# Patient Record
Sex: Female | Born: 1965 | Smoking: Never smoker
Health system: Southern US, Community
[De-identification: ages and names within clinical notes are randomized; demographics above are authoritative.]

## PROBLEM LIST (undated history)

## (undated) DIAGNOSIS — G5603 Carpal tunnel syndrome, bilateral upper limbs: Secondary | ICD-10-CM

## (undated) DIAGNOSIS — M545 Low back pain, unspecified: Secondary | ICD-10-CM

## (undated) DIAGNOSIS — R519 Headache, unspecified: Secondary | ICD-10-CM

## (undated) DIAGNOSIS — K219 Gastro-esophageal reflux disease without esophagitis: Secondary | ICD-10-CM

## (undated) DIAGNOSIS — Z9889 Other specified postprocedural states: Secondary | ICD-10-CM

## (undated) DIAGNOSIS — C801 Malignant (primary) neoplasm, unspecified: Secondary | ICD-10-CM

## (undated) DIAGNOSIS — E039 Hypothyroidism, unspecified: Secondary | ICD-10-CM

## (undated) DIAGNOSIS — F329 Major depressive disorder, single episode, unspecified: Secondary | ICD-10-CM

## (undated) DIAGNOSIS — R112 Nausea with vomiting, unspecified: Secondary | ICD-10-CM

## (undated) DIAGNOSIS — I517 Cardiomegaly: Secondary | ICD-10-CM

## (undated) DIAGNOSIS — E069 Thyroiditis, unspecified: Secondary | ICD-10-CM

## (undated) DIAGNOSIS — M1990 Unspecified osteoarthritis, unspecified site: Secondary | ICD-10-CM

## (undated) DIAGNOSIS — H5010 Unspecified exotropia: Secondary | ICD-10-CM

## (undated) DIAGNOSIS — N390 Urinary tract infection, site not specified: Secondary | ICD-10-CM

## (undated) DIAGNOSIS — R51 Headache: Secondary | ICD-10-CM

## (undated) DIAGNOSIS — J189 Pneumonia, unspecified organism: Secondary | ICD-10-CM

## (undated) DIAGNOSIS — L0390 Cellulitis, unspecified: Secondary | ICD-10-CM

## (undated) DIAGNOSIS — G4730 Sleep apnea, unspecified: Secondary | ICD-10-CM

## (undated) DIAGNOSIS — E0590 Thyrotoxicosis, unspecified without thyrotoxic crisis or storm: Secondary | ICD-10-CM

## (undated) DIAGNOSIS — J329 Chronic sinusitis, unspecified: Secondary | ICD-10-CM

## (undated) DIAGNOSIS — J45909 Unspecified asthma, uncomplicated: Secondary | ICD-10-CM

## (undated) DIAGNOSIS — F32A Depression, unspecified: Secondary | ICD-10-CM

## (undated) DIAGNOSIS — N731 Chronic parametritis and pelvic cellulitis: Secondary | ICD-10-CM

## (undated) DIAGNOSIS — E669 Obesity, unspecified: Secondary | ICD-10-CM

## (undated) DIAGNOSIS — H53009 Unspecified amblyopia, unspecified eye: Secondary | ICD-10-CM

## (undated) DIAGNOSIS — B009 Herpesviral infection, unspecified: Secondary | ICD-10-CM

## (undated) HISTORY — DX: Malignant (primary) neoplasm, unspecified: C80.1

## (undated) HISTORY — PX: CYST EXCISION: SHX5701

## (undated) HISTORY — PX: ABDOMINAL HYSTERECTOMY: SHX81

## (undated) HISTORY — PX: EYE SURGERY: SHX253

## (undated) HISTORY — PX: TONSILLECTOMY AND ADENOIDECTOMY: SUR1326

## (undated) HISTORY — PX: BILATERAL CARPAL TUNNEL RELEASE: SHX6508

## (undated) HISTORY — PX: CHOLECYSTECTOMY: SHX55

---

## 2011-08-14 HISTORY — PX: PAROTIDECTOMY W/ NECK DISSECTION TOTAL: SUR1004

## 2013-10-01 HISTORY — PX: STRABISMUS SURGERY: SHX218

## 2016-08-21 ENCOUNTER — Other Ambulatory Visit (HOSPITAL_COMMUNITY): Payer: Self-pay | Admitting: Surgery

## 2016-08-27 ENCOUNTER — Encounter: Payer: BLUE CROSS/BLUE SHIELD | Attending: Surgery | Admitting: Registered"

## 2016-08-27 ENCOUNTER — Encounter: Payer: Self-pay | Admitting: Registered"

## 2016-08-27 DIAGNOSIS — E669 Obesity, unspecified: Secondary | ICD-10-CM

## 2016-08-27 DIAGNOSIS — Z713 Dietary counseling and surveillance: Secondary | ICD-10-CM | POA: Insufficient documentation

## 2016-08-27 DIAGNOSIS — Z6837 Body mass index (BMI) 37.0-37.9, adult: Secondary | ICD-10-CM | POA: Diagnosis not present

## 2016-08-27 NOTE — Progress Notes (Signed)
Pre-Op Assessment Visit:  Pre-Operative RYGB Surgery  Medical Nutrition Therapy:  Appt start time: 2:10  End time:  3:10  Patient was seen on 08/27/2016 for Pre-Operative Nutrition Assessment. Assessment and letter of approval faxed to [facility] Bariatric Surgery Program coordinator on 08/27/2016.   Pt expectation of surgery: walk up stairs without having short of breath, able to run again, relieve back pain  Pt expectation of Dietitian: "help me be able to do what needs to be done to keep weight off and help to stay on track"  Start weight at [facility]: 209.7 BMI: 38.05   Pt states she works 3rd shift 7p-7a at [employer]. Pt states she doesn't like certain vegetables, but willing to continue to try them.   Per insurance, pt needs 6 SWL visits prior to surgery.    24 hr Dietary Recall: First Meal: Subway-subs or corndogs Snack: candy bar, soda Second Meal: ham and cheese sandwich, baked cheetos Snack: peanut butter, freeze pops Third Meal: chicken pot pie Snack: none Beverages: soda, water w/ crystal light, unsweet tea w/ splenda  Encouraged to engage in 150 minutes of moderate physical activity including cardiovascular and weight baring weekly  Handouts given during visit include:  . Pre-Op Goals . Bariatric Surgery Protein Shakes . Vitamin and Mineral Recommendations  During the appointment today the following Pre-Op Goals were reviewed with the patient: . Maintain or lose weight as instructed by your surgeon . Make healthy food choices . Begin to limit portion sizes . Limited concentrated sugars and fried foods . Keep fat/sugar in the single digits per serving on          food labels . Practice CHEWING your food  (aim for 30 chews per bite or until applesauce consistency) . Practice not drinking 15 minutes before, during, and 30 minutes after each meal/snack . Avoid all carbonated beverages  . Avoid/limit caffeinated beverages  . Avoid all sugar-sweetened  beverages . Consume 3 meals per day; eat every 3-5 hours . Make a list of non-food related activities . Aim for 64-100 ounces of FLUID daily  . Aim for at least 60-80 grams of PROTEIN daily . Look for a liquid protein source that contain ?15 g protein and ?5 g carbohydrate  (ex: shakes, drinks, shots) . Physical activity is an important part of a healthy lifestyle so keep it moving!  Follow diet recommendations listed below Energy and Macronutrient Recommendations: Calories: 1600 Carbohydrate: 180 Protein: 120 Fat: 44  Demonstrated degree of understanding via:  Teach Back   Teaching Method Utilized:  Visual Auditory Hands on  Barriers to learning/adherence to lifestyle change: none  Patient to call the [facility] to enroll in Pre-Op and Post-Op Nutrition Education when surgery date is scheduled.

## 2016-09-27 ENCOUNTER — Encounter: Payer: Self-pay | Admitting: Registered"

## 2016-09-27 ENCOUNTER — Encounter: Payer: BLUE CROSS/BLUE SHIELD | Attending: Surgery | Admitting: Registered"

## 2016-09-27 DIAGNOSIS — E669 Obesity, unspecified: Secondary | ICD-10-CM

## 2016-09-27 DIAGNOSIS — Z6837 Body mass index (BMI) 37.0-37.9, adult: Secondary | ICD-10-CM | POA: Diagnosis not present

## 2016-09-27 DIAGNOSIS — Z713 Dietary counseling and surveillance: Secondary | ICD-10-CM | POA: Insufficient documentation

## 2016-09-27 NOTE — Progress Notes (Signed)
Appt start time: 4:46 end time: 5:05  Assessment: 1st SWL Appointment.   Start Wt at [facility]: 209.7 Wt: 210.9 BMI: 38.26   Pt arrives having gained 1.2 lbs from previous visit. Pt states she has decreased her soda intake and drinking more water (about 1 gallon a day). Pt states she is trying to walk more but limited to back pain. Pt reports she has decreased from 3-6 sodas/day to drinking 0-1 soda per day. Pt states she typically swims daily but pool is being cleaned due to recent rain. Pt states she feels bloated and acid reflux makes her throat feel like its burning. Pt states she has an upcoming appt related to GERD.  Pt states she hopes to have surgery done by the end of 2018.    MEDICATIONS: See list; change from prilosec to protonix due to insurance coverage   DIETARY INTAKE:  24-hr recall:  B ( AM): sandwich, baked chips  Snk ( AM): none  L ( PM): sandwich, baked chips Snk ( PM): none D ( PM): none Snk ( PM): none Beverages: water, soda (sometimes)  Usual physical activity: 20-30 min/day, 1-2x/week and swimming daily  Diet to Follow: 1600 calories 180 g carbohydrates 120 g protein 44 g fat  Preferred Learning Style:   No preference indicated   Learning Readiness:   Ready  Change in progress     Nutritional Diagnosis:  [place]-3.3 Overweight/obesity related to past poor dietary habits and physical inactivity as evidenced by patient w/ planned RYGB surgery following dietary guidelines for continued weight loss.    Intervention:  Nutrition counseling for upcoming Bariatric Surgery.  Goals:  - Try alkaline water. - Aim to get at least 30 min of physical activity 5 days/week.  - Look for a liquid protein source that contain ?15 g protein and ?5 g carbohydrate  (ex: shakes, drinks, shots).   Teaching Method Utilized:  Visual Auditory Hands on  Handouts given during visit include:  Protein Shakes  Nutrition Care Manual: Acid reflux prescription  Barriers to  learning/adherence to lifestyle change: none  Demonstrated degree of understanding via:  Teach Back   Monitoring/Evaluation:  Dietary intake, exercise, and body weight in 1 month(s).

## 2016-09-27 NOTE — Patient Instructions (Addendum)
-   Try alkaline water.  - Aim to get at least 30 min of physical activity 5 days/week.   - Look for a liquid protein source that contain ?15 g protein and ?5 g carbohydrate  (ex: shakes, drinks, shots).

## 2016-10-25 ENCOUNTER — Encounter: Payer: Self-pay | Admitting: Registered"

## 2016-10-25 ENCOUNTER — Encounter: Payer: BLUE CROSS/BLUE SHIELD | Attending: Surgery | Admitting: Registered"

## 2016-10-25 DIAGNOSIS — Z6837 Body mass index (BMI) 37.0-37.9, adult: Secondary | ICD-10-CM | POA: Insufficient documentation

## 2016-10-25 DIAGNOSIS — Z713 Dietary counseling and surveillance: Secondary | ICD-10-CM | POA: Diagnosis present

## 2016-10-25 DIAGNOSIS — E669 Obesity, unspecified: Secondary | ICD-10-CM

## 2016-10-25 NOTE — Progress Notes (Signed)
Appt start time: 4:38 end time: 5:05  Assessment: 2nd SWL Appointment.   Start Wt at [facility]: 209.7 Wt: 213.3 BMI: 38.70   Pt arrives having gained 2.4 lbs from previous visit. Pt states she has found protein shakes that she likes: likes vanilla mostly. Pt states she wants to be able to run again. Pt states she is trying not to do snacks. Pt states she started alkaline water and still has some GERD. Pt states she may try and lose weight on her own and not have surgery, but unsure if she will be able to keep the weight off. Pt states she is unsure if surgery will improve back pain.    MEDICATIONS: See list; change from prilosec to protonix due to insurance coverage   DIETARY INTAKE:  24-hr recall:  B ( AM): omelette   Snk ( AM): sometimes peanut butter snacks, sugar free jello w/ fruit L ( PM): sandwich, baked chips Snk ( PM): none D ( PM): chicken pot pie or lean pocket Snk ( PM): none Beverages: water, soda (sometimes)  Usual physical activity: 20-30 min/day, 1-2x/week and swimming daily; gym 2-3x week, [place]  Diet to Follow: 1600 calories 180 g carbohydrates 120 g protein 44 g fat  Preferred Learning Style:   No preference indicated   Learning Readiness:   Ready  Change in progress     Nutritional Diagnosis:  [place]-3.3 Overweight/obesity related to past poor dietary habits and physical inactivity as evidenced by patient w/ planned RYGB surgery following dietary guidelines for continued weight loss.    Intervention:  Nutrition counseling for upcoming Bariatric Surgery.  Goals:  - Increase daily vegetable intake. - Add in snacks during day: fresh fruit and/or vegetables with peanut butter, nuts, cheese, protein shakes, etc.   Teaching Method Utilized:  Visual Auditory Hands on  Handouts given during visit include:  none  Barriers to learning/adherence to lifestyle change: none  Demonstrated degree of understanding via:  Teach Back    Monitoring/Evaluation:  Dietary intake, exercise, and body weight in 1 month(s).

## 2016-10-25 NOTE — Patient Instructions (Addendum)
-   Increase daily vegetable intake.  - Add in snacks during day: fresh fruit and/or vegetables with peanut butter, nuts, cheese, protein shakes, etc.

## 2016-11-22 ENCOUNTER — Encounter: Payer: BLUE CROSS/BLUE SHIELD | Attending: Surgery | Admitting: Registered"

## 2016-11-22 ENCOUNTER — Encounter: Payer: Self-pay | Admitting: Registered"

## 2016-11-22 DIAGNOSIS — Z6837 Body mass index (BMI) 37.0-37.9, adult: Secondary | ICD-10-CM | POA: Insufficient documentation

## 2016-11-22 DIAGNOSIS — Z713 Dietary counseling and surveillance: Secondary | ICD-10-CM | POA: Diagnosis not present

## 2016-11-22 DIAGNOSIS — E669 Obesity, unspecified: Secondary | ICD-10-CM

## 2016-11-22 NOTE — Patient Instructions (Signed)
-   Meal prep with salads at the beginning of the week. Pack tomatoes separately to avoid salad becoming soggy.   - Use vegetable tray options as snacks. Pack for work.   - Aim to not drink 15 min before, during, and wait 30 min after eating to drink.

## 2016-11-22 NOTE — Progress Notes (Signed)
Appt start time: 3:00 end time: 3:22  Assessment: 3rd SWL Appointment.   Start Wt at [facility]: 209.7 Wt: 213.5 BMI: 38.74   Pt arrives having maintained weight from previous visit. Pt states she hasn't been able to do well with adding in non-starchy vegetables. 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 Pt states she is sure about surgery, for the purpose of improving pain and GERD. Pt states alkaline water improved her GERD for a little while and then it returned. Pt states she has been eating snacks of nuts and cheese along with fruit. Pt states she has been getting better at chewing 30 times a per bite.    Pt states she has found protein shakes that she likes: likes vanilla mostly. Pt states she wants to be able to run again. Pt states she is unsure if surgery will improve back pain.    MEDICATIONS: See list; change from prilosec to protonix due to insurance coverage   DIETARY INTAKE:  24-hr recall:  B ( AM): omelette   Snk ( AM): sometimes peanut butter snacks, sugar free jello w/ fruit L ( PM): sandwich, baked chips Snk ( PM): none D ( PM): chicken pot pie or lean pocket Snk ( PM): none Beverages: water, soda (sometimes)  Usual physical activity: 20-30 min/day, 1-2x/week and swimming daily; gym 2-3x week, [place]  Diet to Follow: 1600 calories 180 g carbohydrates 120 g protein 44 g fat  Preferred Learning Style:   No preference indicated   Learning Readiness:   Ready  Change in progress     Nutritional Diagnosis:  -3.3 Overweight/obesity related to past poor dietary habits and physical inactivity as evidenced by patient w/ planned RYGB surgery following dietary guidelines for continued weight loss.    Intervention:  Nutrition counseling for upcoming Bariatric Surgery.  Goals:  -  Meal prep with salads at the beginning of the week. Pack tomatoes separately to avoid salad becoming soggy.  - Use vegetable tray options as snacks. Pack for work.  - Aim to not drink 15 min before, during, and wait 30 min after eating to drink. .   Teaching Method Utilized:  Visual Auditory Hands on  Handouts given during visit include:  none  Barriers to learning/adherence to lifestyle change: none  Demonstrated degree of understanding via:  Teach Back   Monitoring/Evaluation:  Dietary intake, exercise, and body weight in 1 month(s).

## 2016-12-06 ENCOUNTER — Other Ambulatory Visit (HOSPITAL_COMMUNITY): Payer: Self-pay | Admitting: Surgery

## 2016-12-11 ENCOUNTER — Other Ambulatory Visit: Payer: Self-pay

## 2016-12-11 ENCOUNTER — Ambulatory Visit (HOSPITAL_COMMUNITY)
Admission: RE | Admit: 2016-12-11 | Discharge: 2016-12-11 | Disposition: A | Discharge status: Home / Self Care | Payer: BLUE CROSS/BLUE SHIELD | Source: Ambulatory Visit | Attending: Surgery | Admitting: Surgery

## 2016-12-11 DIAGNOSIS — I517 Cardiomegaly: Secondary | ICD-10-CM | POA: Diagnosis not present

## 2016-12-11 DIAGNOSIS — Z01810 Encounter for preprocedural cardiovascular examination: Secondary | ICD-10-CM | POA: Diagnosis not present

## 2016-12-11 DIAGNOSIS — R9431 Abnormal electrocardiogram [ECG] [EKG]: Secondary | ICD-10-CM | POA: Insufficient documentation

## 2016-12-12 DIAGNOSIS — I517 Cardiomegaly: Secondary | ICD-10-CM

## 2016-12-12 HISTORY — DX: Cardiomegaly: I51.7

## 2016-12-20 ENCOUNTER — Encounter: Payer: BLUE CROSS/BLUE SHIELD | Attending: Surgery | Admitting: Registered"

## 2016-12-20 ENCOUNTER — Encounter: Payer: Self-pay | Admitting: Registered"

## 2016-12-20 DIAGNOSIS — Z6837 Body mass index (BMI) 37.0-37.9, adult: Secondary | ICD-10-CM | POA: Insufficient documentation

## 2016-12-20 DIAGNOSIS — E669 Obesity, unspecified: Secondary | ICD-10-CM

## 2016-12-20 DIAGNOSIS — Z713 Dietary counseling and surveillance: Secondary | ICD-10-CM | POA: Insufficient documentation

## 2016-12-20 NOTE — Progress Notes (Signed)
Appt start time: 2:30 end time: 2:45  Assessment: 4th SWL Appointment.   Start Wt at [facility]: 209.7 Wt: 213.5 BMI: 38.74   Pt arrives having maintained weight from previous visit. Pt states she has been eating more non-starchy vegetables and adding new things into her salad such as kale and spinach. Pt states she had EKG, chest x ray, and stomach x ray last week; states she is ready for surgery. Pt states she is doing better than expected with not drinking with meals. Pt states she drinks a lot of water during work and stops drinking as lunchtime approaches. Pt states walks 10,000-15,000 steps/night 3-4x/week at work.   Pt states she has found protein shakes that she likes: likes vanilla mostly. Pt states she wants to be able to run again. Pt states she is unsure if surgery will improve back pain.    MEDICATIONS: See list; change from prilosec to protonix due to insurance coverage   DIETARY INTAKE:  24-hr recall:  B ( AM): omelette   Snk ( AM): sometimes peanut butter snacks, sugar free jello w/ fruit L ( PM): sandwich, baked chips Snk ( PM): none D ( PM): chicken pot pie or lean pocket Snk ( PM): none Beverages: water, soda (sometimes)  Usual physical activity: gym 2-3x week, walking 20 min, 2-3x/day   Diet to Follow: 1600 calories 180 g carbohydrates 120 g protein 44 g fat  Preferred Learning Style:   No preference indicated   Learning Readiness:   Ready  Change in progress     Nutritional Diagnosis:  [place]-3.3 Overweight/obesity related to past poor dietary habits and physical inactivity as evidenced by patient w/ planned RYGB surgery following dietary guidelines for continued weight loss.    Intervention:  Nutrition counseling for upcoming Bariatric Surgery.  Goals:  - Try to bike more while at work.  - Aim to add in arm exercises or resistance bands for physical activity.  - Try bariatric multivitamins to see which one you like.  - Keep the great work with what  you're already doing.   Teaching Method Utilized:  Visual Auditory Hands on  Handouts given during visit include:  none  Barriers to learning/adherence to lifestyle change: none  Demonstrated degree of understanding via:  Teach Back   Monitoring/Evaluation:  Dietary intake, exercise, and body weight in 1 month(s).

## 2016-12-20 NOTE — Patient Instructions (Addendum)
-   Try to bike more while at work.   - Aim to add in arm exercises or resistance bands for physical activity.   - Try bariatric multivitamins to see which one you like.   - Keep the great work with what you're already doing.

## 2017-01-17 ENCOUNTER — Encounter: Payer: Self-pay | Admitting: Registered"

## 2017-01-17 ENCOUNTER — Encounter: Payer: BLUE CROSS/BLUE SHIELD | Attending: Surgery | Admitting: Registered"

## 2017-01-17 DIAGNOSIS — Z6837 Body mass index (BMI) 37.0-37.9, adult: Secondary | ICD-10-CM | POA: Insufficient documentation

## 2017-01-17 DIAGNOSIS — E669 Obesity, unspecified: Secondary | ICD-10-CM

## 2017-01-17 DIAGNOSIS — Z713 Dietary counseling and surveillance: Secondary | ICD-10-CM | POA: Diagnosis present

## 2017-01-17 NOTE — Patient Instructions (Addendum)
-   Aim to not drink 15 minutes before eating, not while eating, and waiting 30 minutes after eating to drink while at work.   - Aim to not have drink as accessible when eating at work.  - Continue to aim at least 30 times per bite or to applesauce consistency.

## 2017-01-17 NOTE — Progress Notes (Signed)
Appt start time: 11:15  end time: 11:35  Assessment: 5th SWL Appointment.   Start Wt at [facility]: 209.7 Wt: 215.6 BMI: 39.12   Pt arrives having gained 2 lbs from previous visit. Pt states she has some heartburn probably from recently drinking soda. Pt states she does well with not drinking with meals at home more so than at work. Pt states she is still working on chewing but it is hard. Pt states she is doing well with adding well-balanced meals to her daily routine.   Pt states she has been eating more non-starchy vegetables and adding new things into her salad such as kale and spinach. Pt states she had EKG, chest x ray, and stomach x ray last week; states she is ready for surgery. Pt states she is doing better than expected with not drinking with meals. Pt states she drinks a lot of water during work and stops drinking as lunchtime approaches. Pt states walks 10,000-15,000 steps/night 3-4x/week at work.   Pt states she has found protein shakes that she likes: likes vanilla mostly. Pt states she wants to be able to run again. Pt states she is unsure if surgery will improve back pain.    MEDICATIONS: See list; change from prilosec to protonix due to insurance coverage   DIETARY INTAKE:  24-hr recall:  B ( AM): omelette   Snk ( AM): sometimes peanut butter snacks, sugar free jello w/ fruit L ( PM): salad with protein  Snk ( PM): none D ( PM): chicken pot pie or lean pocket Snk ( PM): none Beverages: water, soda (sometimes)  Usual physical activity: stationary bike 20 min, 2-5x/week   Diet to Follow: 1600 calories 180 g carbohydrates 120 g protein 44 g fat  Preferred Learning Style:   No preference indicated   Learning Readiness:   Ready  Change in progress     Nutritional Diagnosis:  [place]-3.3 Overweight/obesity related to past poor dietary habits and physical inactivity as evidenced by patient w/ planned RYGB surgery following dietary guidelines for continued weight  loss.    Intervention:  Nutrition counseling for upcoming Bariatric Surgery.  Goals:  - Aim to not drink 15 minutes before eating, not while eating, and waiting 30 minutes after eating to drink while at work.  - Aim to not have drink as accessible when eating at work. - Continue to aim at least 30 times per bite or to applesauce consistency.   Teaching Method Utilized:  Visual Auditory Hands on  Handouts given during visit include:  none  Barriers to learning/adherence to lifestyle change: none  Demonstrated degree of understanding via:  Teach Back   Monitoring/Evaluation:  Dietary intake, exercise, and body weight in 1 month(s).

## 2017-02-14 ENCOUNTER — Encounter: Payer: Self-pay | Admitting: Registered"

## 2017-02-14 ENCOUNTER — Encounter: Payer: BLUE CROSS/BLUE SHIELD | Attending: Surgery | Admitting: Registered"

## 2017-02-14 DIAGNOSIS — Z713 Dietary counseling and surveillance: Secondary | ICD-10-CM | POA: Diagnosis not present

## 2017-02-14 DIAGNOSIS — Z6837 Body mass index (BMI) 37.0-37.9, adult: Secondary | ICD-10-CM | POA: Insufficient documentation

## 2017-02-14 DIAGNOSIS — E669 Obesity, unspecified: Secondary | ICD-10-CM

## 2017-02-14 NOTE — Patient Instructions (Addendum)
-   Continue to get back on track with not drinking 15 minutes before, not while eating, and waiting 30 minutes after eating to drink.   - Continue to get back with healthy eating habits in preparation for surgery.

## 2017-02-14 NOTE — Progress Notes (Signed)
Appt start time: 5:00  end time: 5:15  Assessment: 6th SWL Appointment.   Start Wt at [facility]: 209.7 Wt: 218.7 BMI: 39.68   Pt arrives having gained 3.1 lbs from previous visit. Pt states she is taking a MVI. Pt states she went out of town for 2 weeks over Thanksgiving break and got off track with habits.    MEDICATIONS: See list; change from prilosec to protonix due to insurance coverage   DIETARY INTAKE:  24-hr recall:  B ( AM): omelette   Snk ( AM): sometimes peanut butter snacks, sugar free jello w/ fruit L ( PM): salad with protein  Snk ( PM): none D ( PM): chicken pot pie or lean pocket Snk ( PM): none Beverages: water, soda (sometimes)  Usual physical activity: stationary bike 20 min, 2-5x/week   Diet to Follow: 1600 calories 180 g carbohydrates 120 g protein 44 g fat  Preferred Learning Style:   No preference indicated   Learning Readiness:   Ready  Change in progress     Nutritional Diagnosis:  [place]-3.3 Overweight/obesity related to past poor dietary habits and physical inactivity as evidenced by patient w/ planned RYGB surgery following dietary guidelines for continued weight loss.    Intervention:  Nutrition counseling for upcoming Bariatric Surgery.  Goals:  - Continue to get back on track with not drinking 15 minutes before, not while eating, and waiting 30 minutes after eating to drink.  - Continue to get back with healthy eating habits in preparation for surgery.  Teaching Method Utilized:  Visual Auditory Hands on  Handouts given during visit include:  none  Barriers to learning/adherence to lifestyle change: none  Demonstrated degree of understanding via:  Teach Back   Monitoring/Evaluation:  Dietary intake, exercise, and body weight prn.

## 2017-03-18 ENCOUNTER — Encounter: Payer: BLUE CROSS/BLUE SHIELD | Attending: Surgery | Admitting: Skilled Nursing Facility1

## 2017-03-18 DIAGNOSIS — E669 Obesity, unspecified: Secondary | ICD-10-CM

## 2017-03-18 DIAGNOSIS — Z6837 Body mass index (BMI) 37.0-37.9, adult: Secondary | ICD-10-CM | POA: Insufficient documentation

## 2017-03-18 DIAGNOSIS — Z713 Dietary counseling and surveillance: Secondary | ICD-10-CM | POA: Diagnosis present

## 2017-03-20 ENCOUNTER — Encounter: Payer: Self-pay | Admitting: Skilled Nursing Facility1

## 2017-03-20 NOTE — Progress Notes (Signed)
Pre-Operative Nutrition Class:  Appt start time: 2409   End time:  1830.  Patient was seen on 03/18/2017 for Pre-Operative Bariatric Surgery Education [facility].   Surgery date:  Surgery type: RYGB Start weight at [facility]: 209.7 Weight today: 223.7  Samples given per MNT protocol. Patient educated on appropriate usage: Bariatric Advantage Multivitamin Lot # [id number] Exp: 09/2017  Bariatric Advantage Calcium  Lot # [id number] Exp:11-10-17  [place] Protein Shake Lot # [id number] Exp: 12-15-17 The following the learning objectives were met by the patient during this course:  Identify Pre-Op Dietary Goals and will begin 2 weeks pre-operatively  Identify appropriate sources of fluids and proteins   State protein recommendations and appropriate sources pre and post-operatively  Identify Post-Operative Dietary Goals and will follow for 2 weeks post-operatively  Identify appropriate multivitamin and calcium sources  Describe the need for physical activity post-operatively and will follow MD recommendations  State when to call healthcare provider regarding medication questions or post-operative complications  Handouts given during class include:  Pre-Op Bariatric Surgery Diet Handout  Protein Shake Handout  Post-Op Bariatric Surgery Nutrition Handout  BELT Program Information Flyer  Support Group Information Flyer  [facility] Bariatric Supplements Price List  Follow-Up Plan: [facility] diet advancement per MD.

## 2017-06-20 ENCOUNTER — Encounter (HOSPITAL_COMMUNITY): Payer: Self-pay

## 2017-06-20 NOTE — Patient Instructions (Addendum)
Your procedure is scheduled on: Monday, July 01, 2017   Surgery Time:  11:25AM-2:55PM   Report to [place]  Entrance    Report to admitting at 9:25 AM   Call this number if you have problems the morning of surgery [phone]   BRING CPAP MASK AND TUBING DAY OF SURGERY   Do not eat food or drink liquids :After Midnight.   Do NOT smoke after Midnight   Take these medicines the morning of surgery with A SIP OF WATER: Omeprazole   Bring Asthma Inhaler day of surgery                               You may not have any metal on your body including hair pins, jewelry, and body piercings             Do not wear make-up, lotions, powders, perfumes/cologne, or deodorant             Do not wear nail polish.  Do not shave  48 hours prior to surgery.             Do not bring valuables to the hospital. [place].   Contacts, dentures or bridgework may not be worn into surgery.   Leave suitcase in the car. After surgery it may be brought to your room.   Special Instructions: Bring a copy of your healthcare power of attorney and living will documents         the day of surgery if you haven't scanned them in before.              Please read over the following fact sheets you were given:  [facility] - Preparing for Surgery Before surgery, you can play an important role.  Because skin is not sterile, your skin needs to be as free of germs as possible.  You can reduce the number of germs on your skin by washing with CHG (chlorahexidine gluconate) soap before surgery.  CHG is an antiseptic cleaner which kills germs and bonds with the skin to continue killing germs even after washing. Please DO NOT use if you have an allergy to CHG or antibacterial soaps.  If your skin becomes reddened/irritated stop using the CHG and inform your nurse when you arrive at Short Stay. Do not shave (including legs and underarms) for at least 48  hours prior to the first CHG shower.  You may shave your face/neck.  Please follow these instructions carefully:  1.  Shower with CHG Soap the night before surgery and the  morning of surgery.  2.  If you choose to wash your hair, wash your hair first as usual with your normal  shampoo.  3.  After you shampoo, rinse your hair and body thoroughly to remove the shampoo.                             4.  Use CHG as you would any other liquid soap.  You can apply chg directly to the skin and wash.  Gently with a scrungie or clean washcloth.  5.  Apply the CHG Soap to your body ONLY FROM THE NECK DOWN.   Do   not use on face/ open  Wound or open sores. Avoid contact with eyes, ears mouth and   genitals (private parts).                       Wash face,  Genitals (private parts) with your normal soap.             6.  Wash thoroughly, paying special attention to the area where your    surgery  will be performed.  7.  Thoroughly rinse your body with warm water from the neck down.  8.  DO NOT shower/wash with your normal soap after using and rinsing off the CHG Soap.                9.  Pat yourself dry with a clean towel.            10.  Wear clean pajamas.            11.  Place clean sheets on your bed the night of your first shower and do not  sleep with pets. Day of Surgery : Do not apply any lotions/deodorants the morning of surgery.  Please wear clean clothes to the hospital/surgery center.  FAILURE TO FOLLOW THESE INSTRUCTIONS MAY RESULT IN THE CANCELLATION OF YOUR SURGERY  PATIENT SIGNATURE_________________________________  NURSE SIGNATURE__________________________________  ________________________________________________________________________

## 2017-06-20 NOTE — Pre-Procedure Instructions (Signed)
The following are in epic: EKG 12/11/2016 CXR 12/12/2016 KUB 12/11/2016

## 2017-06-24 ENCOUNTER — Encounter (HOSPITAL_COMMUNITY)
Admission: RE | Admit: 2017-06-24 | Discharge: 2017-06-24 | Disposition: A | Discharge status: Home / Self Care | Payer: BLUE CROSS/BLUE SHIELD | Source: Ambulatory Visit | Attending: Surgery | Admitting: Surgery

## 2017-06-24 ENCOUNTER — Encounter (HOSPITAL_COMMUNITY): Payer: Self-pay

## 2017-06-24 ENCOUNTER — Other Ambulatory Visit: Payer: Self-pay

## 2017-06-24 DIAGNOSIS — Z01812 Encounter for preprocedural laboratory examination: Secondary | ICD-10-CM | POA: Diagnosis present

## 2017-06-24 HISTORY — DX: Headache: R51

## 2017-06-24 HISTORY — DX: Cardiomegaly: I51.7

## 2017-06-24 HISTORY — DX: Depression, unspecified: F32.A

## 2017-06-24 HISTORY — DX: Urinary tract infection, site not specified: N39.0

## 2017-06-24 HISTORY — DX: Cellulitis, unspecified: L03.90

## 2017-06-24 HISTORY — DX: Hypothyroidism, unspecified: E03.9

## 2017-06-24 HISTORY — DX: Unspecified osteoarthritis, unspecified site: M19.90

## 2017-06-24 HISTORY — DX: Nausea with vomiting, unspecified: R11.2

## 2017-06-24 HISTORY — DX: Major depressive disorder, single episode, unspecified: F32.9

## 2017-06-24 HISTORY — DX: Gastro-esophageal reflux disease without esophagitis: K21.9

## 2017-06-24 HISTORY — DX: Sleep apnea, unspecified: G47.30

## 2017-06-24 HISTORY — DX: Headache, unspecified: R51.9

## 2017-06-24 HISTORY — DX: Low back pain: M54.5

## 2017-06-24 HISTORY — DX: Thyrotoxicosis, unspecified without thyrotoxic crisis or storm: E05.90

## 2017-06-24 HISTORY — DX: Unspecified asthma, uncomplicated: J45.909

## 2017-06-24 HISTORY — DX: Chronic sinusitis, unspecified: J32.9

## 2017-06-24 HISTORY — DX: Pneumonia, unspecified organism: J18.9

## 2017-06-24 HISTORY — DX: Unspecified exotropia: H50.10

## 2017-06-24 HISTORY — DX: Chronic parametritis and pelvic cellulitis: N73.1

## 2017-06-24 HISTORY — DX: Carpal tunnel syndrome, bilateral upper limbs: G56.03

## 2017-06-24 HISTORY — DX: Obesity, unspecified: E66.9

## 2017-06-24 HISTORY — DX: Thyroiditis, unspecified: E06.9

## 2017-06-24 HISTORY — DX: Low back pain, unspecified: M54.50

## 2017-06-24 HISTORY — DX: Other specified postprocedural states: Z98.890

## 2017-06-24 HISTORY — DX: Herpesviral infection, unspecified: B00.9

## 2017-06-24 HISTORY — DX: Unspecified amblyopia, unspecified eye: H53.009

## 2017-06-24 LAB — CBC
HCT: 41.3 % (ref 36.0–46.0)
Hemoglobin: 13 g/dL (ref 12.0–15.0)
MCH: 28.6 pg (ref 26.0–34.0)
MCHC: 31.5 g/dL (ref 30.0–36.0)
MCV: 90.8 fL (ref 78.0–100.0)
PLATELETS: 304 10*3/uL (ref 150–400)
RBC: 4.55 MIL/uL (ref 3.87–5.11)
RDW: 13.2 % (ref 11.5–15.5)
WBC: 5.8 10*3/uL (ref 4.0–10.5)

## 2017-07-01 ENCOUNTER — Inpatient Hospital Stay (HOSPITAL_COMMUNITY): Payer: BLUE CROSS/BLUE SHIELD | Admitting: Certified Registered Nurse Anesthetist

## 2017-07-01 ENCOUNTER — Inpatient Hospital Stay (HOSPITAL_COMMUNITY)
Admission: RE | Admit: 2017-07-01 | Discharge: 2017-07-02 | DRG: 621 | Disposition: A | Discharge status: Home / Self Care | Payer: BLUE CROSS/BLUE SHIELD | Source: Ambulatory Visit | Attending: Surgery | Admitting: Surgery

## 2017-07-01 ENCOUNTER — Encounter (HOSPITAL_COMMUNITY): Payer: Self-pay | Admitting: Emergency Medicine

## 2017-07-01 ENCOUNTER — Encounter (HOSPITAL_COMMUNITY)
Admission: RE | Disposition: A | Discharge status: Home / Self Care | Payer: Self-pay | Source: Ambulatory Visit | Attending: Surgery

## 2017-07-01 ENCOUNTER — Other Ambulatory Visit: Payer: Self-pay

## 2017-07-01 DIAGNOSIS — G4733 Obstructive sleep apnea (adult) (pediatric): Secondary | ICD-10-CM | POA: Diagnosis present

## 2017-07-01 DIAGNOSIS — F329 Major depressive disorder, single episode, unspecified: Secondary | ICD-10-CM | POA: Diagnosis present

## 2017-07-01 DIAGNOSIS — K219 Gastro-esophageal reflux disease without esophagitis: Secondary | ICD-10-CM | POA: Diagnosis present

## 2017-07-01 DIAGNOSIS — Z85820 Personal history of malignant melanoma of skin: Secondary | ICD-10-CM

## 2017-07-01 DIAGNOSIS — M1990 Unspecified osteoarthritis, unspecified site: Secondary | ICD-10-CM | POA: Diagnosis present

## 2017-07-01 DIAGNOSIS — Z79899 Other long term (current) drug therapy: Secondary | ICD-10-CM | POA: Diagnosis not present

## 2017-07-01 DIAGNOSIS — Z6839 Body mass index (BMI) 39.0-39.9, adult: Secondary | ICD-10-CM | POA: Diagnosis not present

## 2017-07-01 HISTORY — PX: GASTRIC ROUX-EN-Y: SHX5262

## 2017-07-01 LAB — CBC
HCT: 38.7 % (ref 36.0–46.0)
HEMOGLOBIN: 12.4 g/dL (ref 12.0–15.0)
MCH: 28.7 pg (ref 26.0–34.0)
MCHC: 32 g/dL (ref 30.0–36.0)
MCV: 89.6 fL (ref 78.0–100.0)
Platelets: 261 10*3/uL (ref 150–400)
RBC: 4.32 MIL/uL (ref 3.87–5.11)
RDW: 13.3 % (ref 11.5–15.5)
WBC: 5.4 10*3/uL (ref 4.0–10.5)

## 2017-07-01 LAB — COMPREHENSIVE METABOLIC PANEL
ALBUMIN: 3.6 g/dL (ref 3.5–5.0)
ALK PHOS: 105 U/L (ref 38–126)
ALT: 30 U/L (ref 14–54)
ANION GAP: 10 (ref 5–15)
AST: 24 U/L (ref 15–41)
BUN: 14 mg/dL (ref 6–20)
CALCIUM: 9.2 mg/dL (ref 8.9–10.3)
CO2: 26 mmol/L (ref 22–32)
Chloride: 104 mmol/L (ref 101–111)
Creatinine, Ser: 0.78 mg/dL (ref 0.44–1.00)
GFR calc Af Amer: >60 mL/min (ref >60)
GFR calc non Af Amer: >60 mL/min (ref >60)
GLUCOSE: 104 mg/dL — AB (ref 65–99)
Potassium: 3.9 mmol/L (ref 3.5–5.1)
SODIUM: 140 mmol/L (ref 135–145)
Total Bilirubin: 0.4 mg/dL (ref 0.3–1.2)
Total Protein: 7.3 g/dL (ref 6.5–8.1)

## 2017-07-01 LAB — TYPE AND SCREEN
ABO/RH(D): A POS
Antibody Screen: NEGATIVE

## 2017-07-01 LAB — ABO/RH: ABO/RH(D): A POS

## 2017-07-01 SURGERY — LAPAROSCOPIC ROUX-EN-Y GASTRIC BYPASS WITH UPPER ENDOSCOPY
Anesthesia: General | Site: Abdomen

## 2017-07-01 MED ORDER — SODIUM CHLORIDE 0.9 % IV SOLN
INTRAVENOUS | Status: DC
  Administered 2017-07-01 – 2017-07-02 (×3): via INTRAVENOUS

## 2017-07-01 MED ORDER — SUGAMMADEX SODIUM 500 MG/5ML IV SOLN
INTRAVENOUS | Status: AC
  Filled 2017-07-01: qty 5

## 2017-07-01 MED ORDER — MIDAZOLAM HCL 5 MG/5ML IJ SOLN
INTRAMUSCULAR | Status: DC | PRN
  Administered 2017-07-01: 2 mg via INTRAVENOUS

## 2017-07-01 MED ORDER — ACETAMINOPHEN 500 MG PO TABS: 1000.0000 mg | ORAL_TABLET | ORAL | Status: DC

## 2017-07-01 MED ORDER — LACTATED RINGERS IR SOLN
Status: DC | PRN
  Administered 2017-07-01: 1000 mL

## 2017-07-01 MED ORDER — OXYCODONE HCL 5 MG/5ML PO SOLN: 5.0000 mg | Freq: Once | ORAL | Status: DC | PRN

## 2017-07-01 MED ORDER — MIDAZOLAM HCL 2 MG/2ML IJ SOLN
INTRAMUSCULAR | Status: AC
  Filled 2017-07-01: qty 2

## 2017-07-01 MED ORDER — MEPERIDINE HCL 50 MG/ML IJ SOLN: 6.2500 mg | INTRAMUSCULAR | Status: DC | PRN

## 2017-07-01 MED ORDER — STERILE WATER FOR IRRIGATION IR SOLN
Status: DC | PRN
  Administered 2017-07-01: 1000 mL

## 2017-07-01 MED ORDER — KETAMINE HCL 10 MG/ML IJ SOLN
INTRAMUSCULAR | Status: DC | PRN
  Administered 2017-07-01: 25 mg via INTRAVENOUS

## 2017-07-01 MED ORDER — LIDOCAINE HCL 2 % IJ SOLN
INTRAMUSCULAR | Status: AC
  Filled 2017-07-01: qty 20

## 2017-07-01 MED ORDER — LIDOCAINE 2% (20 MG/ML) 5 ML SYRINGE
INTRAMUSCULAR | Status: DC | PRN
  Administered 2017-07-01: 60 mg via INTRAVENOUS

## 2017-07-01 MED ORDER — OXYCODONE HCL 5 MG PO TABS: 5.0000 mg | ORAL_TABLET | Freq: Once | ORAL | Status: DC | PRN

## 2017-07-01 MED ORDER — ROCURONIUM BROMIDE 10 MG/ML (PF) SYRINGE
PREFILLED_SYRINGE | INTRAVENOUS | Status: AC
  Filled 2017-07-01: qty 5

## 2017-07-01 MED ORDER — ONDANSETRON HCL 4 MG/2ML IJ SOLN: 4.0000 mg | INTRAMUSCULAR | Status: DC | PRN

## 2017-07-01 MED ORDER — PREMIER PROTEIN SHAKE
2.0000 [oz_av] | ORAL | Status: DC
  Administered 2017-07-02 (×3): 2 [oz_av] via ORAL

## 2017-07-01 MED ORDER — BUPIVACAINE LIPOSOME 1.3 % IJ SUSP
INTRAMUSCULAR | Status: DC | PRN
  Administered 2017-07-01: 20 mL

## 2017-07-01 MED ORDER — DEXAMETHASONE SODIUM PHOSPHATE 4 MG/ML IJ SOLN: 4.0000 mg | INTRAMUSCULAR | Status: DC

## 2017-07-01 MED ORDER — ALBUTEROL SULFATE (2.5 MG/3ML) 0.083% IN NEBU: 2.5000 mg | INHALATION_SOLUTION | RESPIRATORY_TRACT | Status: DC | PRN

## 2017-07-01 MED ORDER — SUCCINYLCHOLINE CHLORIDE 200 MG/10ML IV SOSY
PREFILLED_SYRINGE | INTRAVENOUS | Status: AC
  Filled 2017-07-01: qty 10

## 2017-07-01 MED ORDER — PROMETHAZINE HCL 25 MG/ML IJ SOLN: 6.2500 mg | INTRAMUSCULAR | Status: DC | PRN

## 2017-07-01 MED ORDER — LIDOCAINE 2% (20 MG/ML) 5 ML SYRINGE
INTRAMUSCULAR | Status: AC
  Filled 2017-07-01: qty 5

## 2017-07-01 MED ORDER — ENOXAPARIN SODIUM 40 MG/0.4ML ~~LOC~~ SOLN: 40.0000 mg | SUBCUTANEOUS | Status: DC

## 2017-07-01 MED ORDER — PROPOFOL 10 MG/ML IV BOLUS
INTRAVENOUS | Status: DC | PRN
  Administered 2017-07-01: 200 mg via INTRAVENOUS

## 2017-07-01 MED ORDER — SCOPOLAMINE 1 MG/3DAYS TD PT72
1.0000 | MEDICATED_PATCH | TRANSDERMAL | Status: DC
  Administered 2017-07-01: 1.5 mg via TRANSDERMAL
  Filled 2017-07-01: qty 1

## 2017-07-01 MED ORDER — HYDROMORPHONE HCL 1 MG/ML IJ SOLN
INTRAMUSCULAR | Status: AC
  Filled 2017-07-01: qty 2

## 2017-07-01 MED ORDER — 0.9 % SODIUM CHLORIDE (POUR BTL) OPTIME
TOPICAL | Status: DC | PRN
  Administered 2017-07-01: 1000 mL

## 2017-07-01 MED ORDER — CEFOTETAN DISODIUM-DEXTROSE 2-2.08 GM-%(50ML) IV SOLR: 2.0000 g | INTRAVENOUS | Status: DC

## 2017-07-01 MED ORDER — SCOPOLAMINE 1 MG/3DAYS TD PT72: 1.0000 | MEDICATED_PATCH | TRANSDERMAL | Status: DC

## 2017-07-01 MED ORDER — ACETAMINOPHEN 160 MG/5ML PO SOLN
650.0000 mg | Freq: Four times a day (QID) | ORAL | Status: DC
  Administered 2017-07-01 – 2017-07-02 (×3): 650 mg via ORAL
  Filled 2017-07-01 (×3): qty 20.3

## 2017-07-01 MED ORDER — DEXAMETHASONE SODIUM PHOSPHATE 10 MG/ML IJ SOLN
INTRAMUSCULAR | Status: DC | PRN
  Administered 2017-07-01: 5 mg via INTRAVENOUS

## 2017-07-01 MED ORDER — ENOXAPARIN SODIUM 30 MG/0.3ML ~~LOC~~ SOLN
30.0000 mg | Freq: Two times a day (BID) | SUBCUTANEOUS | Status: DC
  Administered 2017-07-01 – 2017-07-02 (×2): 30 mg via SUBCUTANEOUS
  Filled 2017-07-01 (×2): qty 0.3

## 2017-07-01 MED ORDER — EVICEL 5 ML EX KIT
PACK | Freq: Once | CUTANEOUS | Status: AC
  Administered 2017-07-01: 5 mL
  Filled 2017-07-01: qty 1

## 2017-07-01 MED ORDER — EPHEDRINE 5 MG/ML INJ
INTRAVENOUS | Status: AC
  Filled 2017-07-01: qty 10

## 2017-07-01 MED ORDER — FENTANYL CITRATE (PF) 250 MCG/5ML IJ SOLN
INTRAMUSCULAR | Status: AC
Start: 2017-07-01 — End: 2017-07-01
  Filled 2017-07-01: qty 5

## 2017-07-01 MED ORDER — ENOXAPARIN SODIUM 40 MG/0.4ML ~~LOC~~ SOLN
40.0000 mg | Freq: Once | SUBCUTANEOUS | Status: AC
  Administered 2017-07-01: 40 mg via SUBCUTANEOUS
  Filled 2017-07-01: qty 0.4

## 2017-07-01 MED ORDER — GABAPENTIN 300 MG PO CAPS: 300.0000 mg | ORAL_CAPSULE | ORAL | Status: DC

## 2017-07-01 MED ORDER — SUGAMMADEX SODIUM 200 MG/2ML IV SOLN
INTRAVENOUS | Status: DC | PRN
  Administered 2017-07-01: 300 mg via INTRAVENOUS

## 2017-07-01 MED ORDER — METOPROLOL TARTRATE 5 MG/5ML IV SOLN: 5.0000 mg | Freq: Four times a day (QID) | INTRAVENOUS | Status: DC | PRN

## 2017-07-01 MED ORDER — EPHEDRINE SULFATE-NACL 50-0.9 MG/10ML-% IV SOSY
PREFILLED_SYRINGE | INTRAVENOUS | Status: DC | PRN
  Administered 2017-07-01 (×4): 10 mg via INTRAVENOUS

## 2017-07-01 MED ORDER — BUPIVACAINE HCL (PF) 0.25 % IJ SOLN
INTRAMUSCULAR | Status: AC
  Filled 2017-07-01: qty 30

## 2017-07-01 MED ORDER — APREPITANT 40 MG PO CAPS: 40.0000 mg | ORAL_CAPSULE | ORAL | Status: DC

## 2017-07-01 MED ORDER — ONDANSETRON HCL 4 MG/2ML IJ SOLN
INTRAMUSCULAR | Status: AC
  Filled 2017-07-01: qty 2

## 2017-07-01 MED ORDER — LACTATED RINGERS IV SOLN
INTRAVENOUS | Status: DC
  Administered 2017-07-01 (×2): via INTRAVENOUS

## 2017-07-01 MED ORDER — CHLORHEXIDINE GLUCONATE 4 % EX LIQD: 60.0000 mL | Freq: Once | CUTANEOUS | Status: DC

## 2017-07-01 MED ORDER — GABAPENTIN 300 MG PO CAPS
300.0000 mg | ORAL_CAPSULE | ORAL | Status: AC
  Administered 2017-07-01: 300 mg via ORAL
  Filled 2017-07-01: qty 1

## 2017-07-01 MED ORDER — HYDRALAZINE HCL 20 MG/ML IJ SOLN: 10.0000 mg | INTRAMUSCULAR | Status: DC | PRN

## 2017-07-01 MED ORDER — PANTOPRAZOLE SODIUM 40 MG PO PACK
40.0000 mg | PACK | Freq: Every day | ORAL | Status: DC
  Filled 2017-07-01: qty 20

## 2017-07-01 MED ORDER — APREPITANT 40 MG PO CAPS
40.0000 mg | ORAL_CAPSULE | ORAL | Status: AC
  Administered 2017-07-01: 40 mg via ORAL
  Filled 2017-07-01: qty 1

## 2017-07-01 MED ORDER — OXYCODONE HCL 5 MG/5ML PO SOLN
5.0000 mg | Freq: Four times a day (QID) | ORAL | Status: DC | PRN
  Administered 2017-07-02: 5 mg via ORAL
  Filled 2017-07-01: qty 5

## 2017-07-01 MED ORDER — DIPHENHYDRAMINE HCL 50 MG/ML IJ SOLN
INTRAMUSCULAR | Status: DC | PRN
  Administered 2017-07-01: 12.5 mg via INTRAVENOUS

## 2017-07-01 MED ORDER — GABAPENTIN 100 MG PO CAPS
200.0000 mg | ORAL_CAPSULE | Freq: Two times a day (BID) | ORAL | Status: DC
  Administered 2017-07-01 – 2017-07-02 (×2): 200 mg via ORAL
  Filled 2017-07-01 (×2): qty 2

## 2017-07-01 MED ORDER — ROCURONIUM BROMIDE 50 MG/5ML IV SOSY
PREFILLED_SYRINGE | INTRAVENOUS | Status: DC | PRN
  Administered 2017-07-01 (×2): 20 mg via INTRAVENOUS
  Administered 2017-07-01: 60 mg via INTRAVENOUS
  Administered 2017-07-01: 20 mg via INTRAVENOUS
  Administered 2017-07-01: 10 mg via INTRAVENOUS
  Administered 2017-07-01: 20 mg via INTRAVENOUS

## 2017-07-01 MED ORDER — BUPIVACAINE LIPOSOME 1.3 % IJ SUSP
20.0000 mL | Freq: Once | INTRAMUSCULAR | Status: DC
  Filled 2017-07-01: qty 20

## 2017-07-01 MED ORDER — LIDOCAINE 2% (20 MG/ML) 5 ML SYRINGE
INTRAMUSCULAR | Status: DC | PRN
  Administered 2017-07-01: 1.5 mg/kg/h via INTRAVENOUS

## 2017-07-01 MED ORDER — SUCCINYLCHOLINE CHLORIDE 200 MG/10ML IV SOSY
PREFILLED_SYRINGE | INTRAVENOUS | Status: DC | PRN
  Administered 2017-07-01: 120 mg via INTRAVENOUS

## 2017-07-01 MED ORDER — DEXAMETHASONE SODIUM PHOSPHATE 10 MG/ML IJ SOLN
INTRAMUSCULAR | Status: AC
  Filled 2017-07-01: qty 1

## 2017-07-01 MED ORDER — HYDROMORPHONE HCL 1 MG/ML IJ SOLN: 0.5000 mg | INTRAMUSCULAR | Status: DC | PRN

## 2017-07-01 MED ORDER — FENTANYL CITRATE (PF) 250 MCG/5ML IJ SOLN
INTRAMUSCULAR | Status: DC | PRN
  Administered 2017-07-01 (×3): 50 ug via INTRAVENOUS

## 2017-07-01 MED ORDER — ACETAMINOPHEN 500 MG PO TABS
1000.0000 mg | ORAL_TABLET | ORAL | Status: AC
  Administered 2017-07-01: 1000 mg via ORAL
  Filled 2017-07-01: qty 2

## 2017-07-01 MED ORDER — SIMETHICONE 80 MG PO CHEW: 80.0000 mg | CHEWABLE_TABLET | Freq: Four times a day (QID) | ORAL | Status: DC | PRN

## 2017-07-01 MED ORDER — CEFOTETAN DISODIUM-DEXTROSE 2-2.08 GM-%(50ML) IV SOLR
2.0000 g | Freq: Once | INTRAVENOUS | Status: AC
  Administered 2017-07-01: 2 g via INTRAVENOUS
  Filled 2017-07-01: qty 50

## 2017-07-01 MED ORDER — BUPIVACAINE HCL (PF) 0.25 % IJ SOLN
INTRAMUSCULAR | Status: DC | PRN
  Administered 2017-07-01: 30 mL

## 2017-07-01 MED ORDER — HYDROMORPHONE HCL 1 MG/ML IJ SOLN
0.2500 mg | INTRAMUSCULAR | Status: DC | PRN
  Administered 2017-07-01 (×3): 0.5 mg via INTRAVENOUS

## 2017-07-01 MED ORDER — ONDANSETRON HCL 4 MG/2ML IJ SOLN
INTRAMUSCULAR | Status: DC | PRN
  Administered 2017-07-01 (×2): 4 mg via INTRAVENOUS

## 2017-07-01 SURGICAL SUPPLY — 72 items
APPLIER CLIP ROT 13.4 12 LRG (CLIP)
BANDAGE ADH SHEER 1  50/CT (GAUZE/BANDAGES/DRESSINGS) ×18 IMPLANT
BENZOIN TINCTURE PRP APPL 2/3 (GAUZE/BANDAGES/DRESSINGS) ×3 IMPLANT
BLADE SURG SZ11 CARB STEEL (BLADE) ×3 IMPLANT
CABLE HIGH FREQUENCY MONO STRZ (ELECTRODE) ×3 IMPLANT
CATH FOLEY LATEX FREE 16FR (CATHETERS) ×3 IMPLANT
CHLORAPREP W/TINT 26ML (MISCELLANEOUS) ×6 IMPLANT
CLIP APPLIE ROT 13.4 12 LRG (CLIP) IMPLANT
CLIP SUT LAPRA TY ABSORB (SUTURE) ×6 IMPLANT
CLOSURE WOUND 1/2 X4 (GAUZE/BANDAGES/DRESSINGS) ×1
COVER SURGICAL LIGHT HANDLE (MISCELLANEOUS) ×3 IMPLANT
DERMABOND ADVANCED (GAUZE/BANDAGES/DRESSINGS)
DERMABOND ADVANCED .7 DNX12 (GAUZE/BANDAGES/DRESSINGS) IMPLANT
DEVICE SUT QUICK LOAD TK 5 (STAPLE) ×4 IMPLANT
DEVICE SUT TI-KNOT TK 5X26 (MISCELLANEOUS) ×2 IMPLANT
DEVICE SUTURE ENDOST 10MM (ENDOMECHANICALS) ×3 IMPLANT
DEVICE TI KNOT TK5 (MISCELLANEOUS) ×1
DRAIN PENROSE LF 8X20.3CM SIL (WOUND CARE) ×3 IMPLANT
ELECT REM PT RETURN 15FT ADLT (MISCELLANEOUS) ×3 IMPLANT
GAUZE 4X4 16PLY RFD (DISPOSABLE) ×3 IMPLANT
GAUZE SPONGE 4X4 12PLY STRL (GAUZE/BANDAGES/DRESSINGS) IMPLANT
GLOVE BIO SURGEON STRL SZ 6 (GLOVE) ×3 IMPLANT
GLOVE INDICATOR 6.5 STRL GRN (GLOVE) ×3 IMPLANT
GOWN STRL REUS W/TWL LRG LVL3 (GOWN DISPOSABLE) ×3 IMPLANT
GOWN STRL REUS W/TWL XL LVL3 (GOWN DISPOSABLE) ×9 IMPLANT
HOVERMATT SINGLE USE (MISCELLANEOUS) ×3 IMPLANT
KIT BASIN OR (CUSTOM PROCEDURE TRAY) ×3 IMPLANT
KIT GASTRIC LAVAGE 34FR ADT (SET/KITS/TRAYS/PACK) ×3 IMPLANT
LUBRICANT JELLY K Y 4OZ (MISCELLANEOUS) ×3 IMPLANT
MARKER SKIN DUAL TIP RULER LAB (MISCELLANEOUS) ×3 IMPLANT
NEEDLE SPNL 22GX3.5 QUINCKE BK (NEEDLE) ×3 IMPLANT
PACK CARDIOVASCULAR III (CUSTOM PROCEDURE TRAY) ×3 IMPLANT
QUICK LOAD TK 5 (STAPLE) ×2
RELOAD ENDO STITCH 2.0 (ENDOMECHANICALS) ×20
RELOAD STAPLER BLUE 60MM (STAPLE) ×6 IMPLANT
RELOAD STAPLER GOLD 60MM (STAPLE) ×1 IMPLANT
RELOAD STAPLER WHITE 60MM (STAPLE) ×2 IMPLANT
SCISSORS LAP 5X45 EPIX DISP (ENDOMECHANICALS) ×3 IMPLANT
SET IRRIG TUBING LAPAROSCOPIC (IRRIGATION / IRRIGATOR) ×3 IMPLANT
SHEARS HARMONIC ACE PLUS 45CM (MISCELLANEOUS) ×3 IMPLANT
SLEEVE ADV FIXATION 12X100MM (TROCAR) ×3 IMPLANT
SLEEVE ADV FIXATION 5X100MM (TROCAR) ×6 IMPLANT
SLEEVE XCEL OPT CAN 5 100 (ENDOMECHANICALS) IMPLANT
SOLUTION ANTI FOG 6CC (MISCELLANEOUS) ×3 IMPLANT
STAPLER ECHELON BIOABSB 60 FLE (MISCELLANEOUS) IMPLANT
STAPLER ECHELON LONG 60 440 (INSTRUMENTS) ×3 IMPLANT
STAPLER RELOAD BLUE 60MM (STAPLE) ×18
STAPLER RELOAD GOLD 60MM (STAPLE) ×3
STAPLER RELOAD WHITE 60MM (STAPLE) ×6
STRIP CLOSURE SKIN 1/2X4 (GAUZE/BANDAGES/DRESSINGS) ×2 IMPLANT
SUT MNCRL AB 4-0 PS2 18 (SUTURE) ×3 IMPLANT
SUT RELOAD ENDO STITCH 2 48X1 (ENDOMECHANICALS) ×6
SUT RELOAD ENDO STITCH 2.0 (ENDOMECHANICALS) ×4
SUT SURGIDAC NAB ES-9 0 48 120 (SUTURE) ×3 IMPLANT
SUT VIC AB 2-0 SH 27 (SUTURE) ×2
SUT VIC AB 2-0 SH 27X BRD (SUTURE) ×1 IMPLANT
SUTURE RELOAD END STTCH 2 48X1 (ENDOMECHANICALS) ×6 IMPLANT
SUTURE RELOAD ENDO STITCH 2.0 (ENDOMECHANICALS) ×4 IMPLANT
SYR 10ML ECCENTRIC (SYRINGE) ×3 IMPLANT
SYR 20CC LL (SYRINGE) ×6 IMPLANT
TIP RIGID 35CM EVICEL (HEMOSTASIS) ×3 IMPLANT
TOWEL OR 17X26 10 PK STRL BLUE (TOWEL DISPOSABLE) ×3 IMPLANT
TOWEL OR NON WOVEN STRL DISP B (DISPOSABLE) ×3 IMPLANT
TROCAR ADV FIXATION 12X100MM (TROCAR) ×3 IMPLANT
TROCAR ADV FIXATION 5X100MM (TROCAR) ×3 IMPLANT
TROCAR BLADELESS OPT 5 100 (ENDOMECHANICALS) IMPLANT
TROCAR XCEL 12X100 BLDLESS (ENDOMECHANICALS) ×3 IMPLANT
TUBE CALIBRATION LAPBAND (TUBING) ×3 IMPLANT
TUBING CONNECTING 10 (TUBING) ×4 IMPLANT
TUBING CONNECTING 10' (TUBING) ×2
TUBING ENDO SMARTCAP PENTAX (MISCELLANEOUS) ×3 IMPLANT
TUBING INSUF HEATED (TUBING) ×3 IMPLANT

## 2017-07-01 NOTE — Progress Notes (Addendum)
Patient started to drink her first cup of water at 2220.

## 2017-07-01 NOTE — Discharge Instructions (Signed)
° ° ° °GASTRIC BYPASS/SLEEVE ° Home Care Instructions ° ° These instructions are to help you care for yourself when you go home. ° °Call: If you have any problems. °• Call [phone] and ask for the surgeon on call °• If you need immediate help, come to the ER at [facility].  °• Tell the ER staff that you are a new post-op gastric bypass or gastric sleeve patient °  °Signs and symptoms to report: • Severe vomiting or nausea °o If you cannot keep down clear liquids for longer than 1 day, call your surgeon  °• Abdominal pain that does not get better after taking your pain medication °• Fever over 100.4° F with chills °• Heart beating over 100 beats a minute °• Shortness of breath at rest °• Chest pain °•  Redness, swelling, drainage, or foul odor at incision (surgical) sites °•  If your incisions open or pull apart °• Swelling or pain in calf (lower leg) °• Diarrhea (Loose bowel movements that happen often), frequent watery, uncontrolled bowel movements °• Constipation, (no bowel movements for 3 days) if this happens: Pick one °o Milk of Magnesia, 2 tablespoons by mouth, 3 times a day for 2 days if needed °o Stop taking Milk of Magnesia once you have a bowel movement °o Call your doctor if constipation continues °Or °o Miralax  (instead of Milk of Magnesia) following the label instructions °o Stop taking Miralax once you have a bowel movement °o Call your doctor if constipation continues °• Anything you think is not normal °  °Normal side effects after surgery: • Unable to sleep at night or unable to focus °• Irritability or moody °• Being tearful (crying) or depressed °These are common complaints, possibly related to your anesthesia medications that put you to sleep, stress of surgery, and change in lifestyle.  This usually goes away a few weeks after surgery.  If these feelings continue, call your primary care doctor. °  °Wound Care: You may have surgical glue, steri-strips, or staples over your incisions after  surgery °• Surgical glue:  Looks like a clear film over your incisions and will wear off a little at a time °• Steri-strips: Strips of tape over your incisions. You may notice a yellowish color on the skin under the steri-strips. This is used to make the   steri-strips stick better. Do not pull the steri-strips off - let them fall off °• Staples: Staples may be removed before you leave the hospital °o If you go home with staples, call [facility], [phone] at for an appointment with your surgeon’s nurse to have staples removed 10 days after surgery. °• Showering: You may shower two (2) days after your surgery unless your surgeon tells you differently °o Wash gently around incisions with warm soapy water, rinse well, and gently pat dry  °o No tub baths until staples are removed, steri-strips fall off or glue is gone.  °  °Medications: • Medications should be liquid or crushed if larger than the size of a dime °• Extended release pills (medication that release a little bit at a time through the day) should NOT be crushed or cut. (examples include XL, ER, DR, SR) °• Depending on the size and number of medications you take, you may need to space (take a few throughout the day)/change the time you take your medications so that you do not over-fill your pouch (smaller stomach) °• Make sure you follow-up with your primary care doctor to   make medication changes needed during rapid weight loss and life-style changes °• If you have diabetes, follow up with the doctor that orders your diabetes medication(s) within one week after surgery and check your blood sugar regularly. °• Do not drive while taking prescription pain medication  °• It is ok to take Tylenol by the bottle instructions with your pain medicine or instead of your pain medicine as needed.  DO NOT TAKE NSAIDS (EXAMPLES OF NSAIDS:  IBUPROFREN/ NAPROXEN)  °Diet:                    First 2 Weeks ° You will see the dietician t about two (2) weeks  after your surgery. The dietician will increase the types of foods you can eat if you are handling liquids well: °• If you have severe vomiting or nausea and cannot keep down clear liquids lasting longer than 1 day, call your surgeon @ ([phone]) °Protein Shake °• Drink at least 2 ounces of shake 5-6 times per day °• Each serving of protein shakes (usually 8 - 12 ounces) should have: °o 15 grams of protein  °o And no more than 5 grams of carbohydrate  °• Goal for protein each day: °o Men = 80 grams per day °o Women = 60 grams per day °• Protein powder may be added to fluids such as non-fat milk or Lactaid milk or unsweetened Soy/Almond milk (limit to 35 grams added protein powder per serving) ° °Hydration °• Slowly increase the amount of water and other clear liquids as tolerated (See Acceptable Fluids) °• Slowly increase the amount of protein shake as tolerated  °•  Sip fluids slowly and throughout the day.  Do not use straws. °• May use sugar substitutes in small amounts (no more than 6 - 8 packets per day; i.e. Splenda) ° °Fluid Goal °• The first goal is to drink at least 8 ounces of protein shake/drink per day (or as directed by the nutritionist); some examples of protein shakes are Syntrax Nectar, Adkins Advantage, EAS Edge HP, and Unjury. See handout from pre-op Bariatric Education Class: °o Slowly increase the amount of protein shake you drink as tolerated °o You may find it easier to slowly sip shakes throughout the day °o It is important to get your proteins in first °• Your fluid goal is to drink 64 - 100 ounces of fluid daily °o It may take a few weeks to build up to this °• 32 oz (or more) should be clear liquids  °And  °• 32 oz (or more) should be full liquids (see below for examples) °• Liquids should not contain sugar, caffeine, or carbonation ° °Clear Liquids: °• Water or Sugar-free flavored water (i.e. Fruit H2O, Propel) °• Decaffeinated coffee or tea (sugar-free) °• Crystal Lite, Wyler’s Lite,  Minute Maid Lite °• Sugar-free Jell-O °• Bouillon or broth °• Sugar-free Popsicle:   *Less than 20 calories each; Limit 1 per day ° °Full Liquids: °Protein Shakes/Drinks + 2 choices per day of other full liquids °• Full liquids must be: °o No More Than 15 grams of Carbs per serving  °o No More Than 3 grams of Fat per serving °• Strained low-fat cream soup (except Cream of Potato or Tomato) °• Non-Fat milk °• Fat-free Lactaid Milk °• Unsweetened Soy Or Unsweetened Almond Milk °• Low Sugar yogurt (Dannon Lite & Fit, Greek yogurt; Oikos Triple Zero; Chobani Simply 100; Yoplait 100 calorie Greek - No Fruit on the Bottom) ° °  °Vitamins   and Minerals • Start 1 day after surgery unless otherwise directed by your surgeon °• 2 Chewable Bariatric Specific Multivitamin / Multimineral Supplement with iron (Example: Bariatric Advantage Multi EA) °• Chewable Calcium with Vitamin D-3 °(Example: 3 Chewable Calcium Plus 600 with Vitamin D-3) °o Take 500 mg three (3) times a day for a total of 1500 mg each day °o Do not take all 3 doses of calcium at one time as it may cause constipation, and you can only absorb 500 mg  at a time  °o Do not mix multivitamins containing iron with calcium supplements; take 2 hours apart °• Menstruating women and those with a history of anemia (a blood disease that causes weakness) may need extra iron °o Talk with your doctor to see if you need more iron °• Do not stop taking or change any vitamins or minerals until you talk to your dietitian or surgeon °• Your Dietitian and/or surgeon must approve all vitamin and mineral supplements °  °Activity and Exercise: Limit your physical activity as instructed by your doctor.  It is important to continue walking at home.  During this time, use these guidelines: °• Do not lift anything greater than ten (10) pounds for at least two (2) weeks °• Do not go back to work or drive until your surgeon says you can °• You may have sex when you feel comfortable  °o It is  VERY important for female patients to use a reliable birth control method; fertility often increases after surgery  °o All hormonal birth control will be ineffective for 30 days after surgery due to medications given during surgery a barrier method must be used. °o Do not get pregnant for at least 18 months °• Start exercising as soon as your doctor tells you that you can °o Make sure your doctor approves any physical activity °• Start with a simple walking program °• Walk 5-15 minutes each day, 7 days per week.  °• Slowly increase until you are walking 30-45 minutes per day °Consider joining our BELT program. [phone] or email [email or web address] °  °Special Instructions Things to remember: °• Use your CPAP when sleeping if this applies to you ° °• [facility] has two free Bariatric Surgery Support Groups that meet monthly °o The 3rd Thursday of each month, 6 pm, [facility]  °o The 2nd Friday of each month, 11:45 am in the private dining room in the basement of  °• It is very important to keep all follow up appointments with your surgeon, dietitian, primary care physician, and behavioral health practitioner °• Routine follow up schedule with your surgeon include appointments at 2-3 weeks, 6-8 weeks, 6 months, and 1 year at a minimum.  Your surgeon may request to see you more often.   °o After the first year, please follow up with your bariatric surgeon and dietitian at least once a year in order to maintain best weight loss results °[facility]: [phone] °[facility]: [phone] °Bariatric Nurse Coordinator: [phone] °  °   Reviewed and Endorsed  °by [facility] Patient Education Committee, June, 2016 °Edits Approved: Aug, 2018 ° ° ° °

## 2017-07-01 NOTE — Op Note (Signed)
KENNEDEY DIGILIO [id number] 1965/09/23 07/01/2017  Preoperative diagnosis: morbid obesity  Postoperative diagnosis: Same   Procedure: Upper endoscopy   Surgeon: Gayland Curry M.D., FACS   Anesthesia: Gen.   Indications for procedure: 52 y.o. yo female undergoing a laparoscopic roux en y gastric bypass and an upper endoscopy was requested to evaluate the anastomosis.  Description of procedure: After we have completed the new gastrojejunostomy, I scrubbed out and obtained the Olympus endoscope. I gently placed endoscope in the patient's oropharynx and gently glided it down the esophagus without any difficulty under direct visualization. Once I was in the gastric pouch, I insufflated the pouch was air. The pouch was approximately 4.5 cm in size. I was able to cannulate and advanced the scope through the gastrojejunostomy. Dr.Connor had placed saline in the upper abdomen. Upon further insufflation of the gastric pouch there was no evidence of bubbles. Upon further inspection of the gastric pouch, the mucosa appeared normal. There is no evidence of any mucosal abnormality. The gastric pouch and Roux limb were decompressed. The width of the gastrojejunal anastomosis was 2 cm. The scope was withdrawn. The patient tolerated this portion of the procedure well. Please see Dr Ron Parker operative note for details regarding the laparoscopic roux-en-y gastric bypass.  Leighton Ruff. Redmond Pulling, MD, FACS General, Bariatric, & Minimally Invasive Surgery [facility], [place]

## 2017-07-01 NOTE — H&P (Signed)
Gabriel Cirri DOB: 11-10-1965 Married / Language: Cleophus Molt / Race: White Female   History of Present Illness  Patient words: She is here for her preoperative visit. She has completed the preoperative workup. She met with Dr. Ardath Sax twice, I do not have the report from their visits but it sounds like no obstacles were identified. She has been following along with the dietitian. She has completed several months of supervised weight loss attempts and has been compliant with recommended lifestyle changes. Unfortunately she has gained weight despite making healthy changes in her diet. She attributes this to decreased activity at work as she is having significant issues with her back pain and arthritis. She denies any sugary drinks or other obvious poor behaviors. Her labs revealed normal T4 and TSH. She continues to have significant issues with reflux, interestingly her upper GI did not show any reflux or hiatal hernia. her preop labs were done at the beginning of March and other than a hemoglobin A1c of 5.8 were unremarkable. Chest x-ray showed cardiomegaly without signs of CHF.  Initial visit 08/15/16: She attended an Neurosurgeon. She is interested in bypass. 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  The patient is a 52 year old female who presents for a bariatric surgery evaluation. Associated symptoms include depressed mood, poor self esteem and back pain (DJD). Initial onset of obesity was after pregnancy. Initial presentation included inability to lose weight. Patient's highest weight was 240 pounds. Patient's lowest weight in adulthood was 150 pounds. Disease complications include cholelithiasis (s/p lap  chole), as well as elevated hemoglobin A1c, history of sleep apnea on CPAP managed by Dr. Felton Clinton and PA Donovan Kail at [facility]. Current diet includes well balanced meals. Exercises less than once per week (had been doing some but limited due to back pain). The patient is currently able to do activities of daily living without limitations, able to work without limitations and able to do housework without limitations. Past treatment has included low calorie diet, appetite suppressants and weight loss group. Surgical history: cholecystectomy and hysterectomy (abdominal via pfannenstiel). Cardiac history: The patient denies smoking. Gastrointestinal History: Patient has heartburn (on daily ppi), and denies dysphagia or irritable bowel disease. MBSQIP recognized comorbidities: Patient has gastroesophageal reflux disease.   Allergies  Bactrim Pediatric *Anti-infective Agents - Misc.**  Latex  Allergies Reconciled   Medication History Acetaminophen ER ('650MG'$  Tablet ER, Oral) Active. Tramadol-Acetaminophen (37.5-'325MG'$  Tablet, Oral) Active. Vitamin B-12 (1000MCG Tablet, Oral) Active. Omeprazole ('20MG'$  Capsule DR, Oral) Active. Premarin (0.'625MG'$ /GM Cream, Vaginal) Active. Vitamin D (Ergocalciferol) (50000UNIT Capsule, Oral) Active. Medications Reconciled  Vitals:   07/01/17 0926  BP: 131/90  Pulse: 86  Resp: 18  Temp: 98.1 F (36.7 C)  SpO2: 100%     Physical Exam The physical exam findings are as follows: Note:She is alert and well-appearing Extraocular motions intact, no scleral icterus Neck without mass or thyromegaly Unlabored respirations, clear bilaterally Regular rate and rhythm, no pedal edema Obese abdomen, well-healed laparoscopic scars from cholecystectomy and pfannenstiel from hysterectomy Extremities warm without deformity Psych normal mood and affect, appropriate insight Neuro grossly intact, normal gait Skin is warm and dry    Assessment & Plan  MORBID  OBESITY (E66.01) Story: BMI 38  with GERD, OSA. Upper GI negative for hiatal hernia. She has completed the rest of the bariatric pathway with no barriers identified. Has completed supervised weight loss attempts and is ready to proceed with surgery. We again discussed the technique of gastric bypass in the typical perioperative recovery. Discussed the importance of no NSAIDs after surgery-she has in the past taken them for her arthritis but feels that she will be able to manage her pain with Tylenol. Again discussed the risks of surgery in both the short and long-term postoperative period. All of her questions were answered to her satisfaction. At this point plan to proceed pending insurance authorization.  Signed electronically by [name], MD

## 2017-07-01 NOTE — Anesthesia Procedure Notes (Signed)
Procedure Name: Intubation Date/Time: 07/01/2017 11:26 AM Performed by: Maxwell Caul, CRNA Pre-anesthesia Checklist: Patient identified, Emergency Drugs available, Suction available and Patient being monitored Patient Re-evaluated:Patient Re-evaluated prior to induction Oxygen Delivery Method: Circle system utilized Preoxygenation: Pre-oxygenation with 100% oxygen Induction Type: IV induction Ventilation: Mask ventilation without difficulty Laryngoscope Size: Mac and 4 Grade View: Grade I Tube type: Oral Tube size: 7.5 mm Number of attempts: 1 Airway Equipment and Method: Stylet Placement Confirmation: ETT inserted through vocal cords under direct vision,  positive ETCO2 and breath sounds checked- equal and bilateral Secured at: 21 cm Tube secured with: Tape Dental Injury: Teeth and Oropharynx as per pre-operative assessment

## 2017-07-01 NOTE — Op Note (Signed)
Operative Note  [facility]  [id number]  [id number]  07/01/2017   Surgeon: [name] MD   Assistant: Greer Pickerel MD   Procedure performed: laparoscopic Roux-en-Y gastric bypass (antecolic, antegastric), hiatal hernia repair, upper endoscopy   Preop diagnosis: Morbid obesity Body mass index is 39.41 kg/m., Obstructive sleep apnea, osteoarthritis, GERD, depression Post-op diagnosis/intraop findings: same, small hiatal hernia   Specimens: none Retained items: none  EBL: minimal cc Complications: none   Description of procedure: After obtaining informed consent and administration of prophylactic lovenox in holding, the patient was taken to the operating room and placed supine on operating room table where general endotracheal anesthesia was initiated, preoperative antibiotics were administered, SCDs applied, and a formal timeout was performed. The abdomen was prepped and draped in usual sterile fashion. Peritoneal access was gained using a Visiport technique in the left upper quadrant and insufflation to 15 mmHg ensued without issue. Gross inspection revealed no evidence of injury. Under direct visualization the remaining trochars were inserted. A laparoscopic assisted bilateral taps block was performed using Exparel mixed with Marcaine. The omentum was reflected cephalad after dividing an adhesive band in the pelvis sharply and the ligament of Treitz identified. The small bowel was followed to a point 40 cm distal to ligament of Treitz at which location the bowel was divided with two fires of the white load linear cutting stapler. A Penrose was sutured to the Roux side of the staple line for future identification. The bowel was measured another 100 cm distal to this and and the site for the jejunojejunostomy was aligned with the end of the biliopancreatic limb. Enterotomies were made with the Harmonic scalpel and the anastomosis was created with the 60 mm white load linear cutting  stapler. The common enterotomy was closed with running 3-0 Vicryl starting on either end and tying centrally. The mesenteric defect was closed with running silk suture secured with Lapra-Ty's. The anastomosis was inspected and appeared widely patent, hemostatic with no gaps in the suture line. Evicel was injected over the anastomosis. We then divided the omentum using the harmonic scalpel. The patient was then placed in steep reverse Trendelenburg. The liver retractor was inserted through a subxiphoid incision and secured for fixed retraction of the left lobe. The calibration tubing was inserted by the anesthetist and inflated the balloon with 67mL air, this test was positive for a hiatal hernia. The right and left crus were dissected posteriorly and a 0 ethibond with lapara-tys used to narrow this hiatus. Repeat test with calibration tubing confirmed appropriately narrowed hiatus as the balloon was not able to traverse the repair.  The Harmonic scalpel was used to enter the perigastric plane and the lesser sac at a point 5 cm distal to the GE junction on the lesser curve. The angle of His was gently bluntly dissected in the target shape of the pouch visualized to exclude any residual fundus. After confirming that all tubes have been removed from the stomach, the gastric pouch was created with serial fires of the linear cutting stapler. As we approached the angle of His, the Ewald tube was inserted to confirm no impingement on the GE junction. The Roux limb with its attached Penrose drain was then identified and brought up to meet the gastric pouch ensuring no twist in the small bowel mesentery. The staple line of the small bowel is directed to the patient's left side. A running 3-0 Vicryl was used to create a posterior suture line for our anastomosis between the  gastric pouch and the small bowel. Gastrotomy and enterotomy was made with the Harmonic scalpel and a blue load linear cutting stapler was used to create a  gastrojejunal anastomosis approximately 2.5cm wide. The common enterotomy was closed with running 3-0 Vicryl starting at either end and tying centrally. At this juncture the Ewald tube was passed through the gastrojejunal anastomosis. An anterior layer of running 3-0 Vicryl was used to complete the gastrojejunal anastomosis. The Ewald tube was removed without difficulty. The Tipton space was closed with a figure-of-eight silk suture. At this point the assistant performing an upper endoscopy with the Roux limb gently clamped with a bowel clamp. Irrigation is instilled in the upper abdomen for a leak test. Please see a separate operative note- the anastomosis is noted to be patent and hemostatic without any leak or bubbles present. The endoscope was removed and the abdomen once again surveyed. The liver retractor was removed under direct visualization. The abdomen was then desufflated and all remaining trochars removed. The skin incisions were closed with running subcuticular Monocryl; benzoin, Steri-Strips and Band-Aids were applied The patient was then awakened, extubated and taken to PACU in stable condition.     All counts were correct at the completion of the case.

## 2017-07-01 NOTE — Anesthesia Preprocedure Evaluation (Signed)
Anesthesia Evaluation  Patient identified by MRN, date of birth, ID band Patient awake    Reviewed: Allergy & Precautions, NPO status , Patient's Chart, lab work & pertinent test results  History of Anesthesia Complications (+) PONV  Airway Mallampati: II  TM Distance: >3 FB Neck ROM: Full    Dental no notable dental hx.    Pulmonary neg pulmonary ROS, asthma , sleep apnea ,    Pulmonary exam normal breath sounds clear to auscultation       Cardiovascular negative cardio ROS Normal cardiovascular exam Rhythm:Regular Rate:Normal     Neuro/Psych Depression negative neurological ROS  negative psych ROS   GI/Hepatic negative GI ROS, Neg liver ROS, GERD  ,  Endo/Other  negative endocrine ROSHypothyroidism   Renal/GU negative Renal ROS  negative genitourinary   Musculoskeletal negative musculoskeletal ROS (+) Arthritis , Osteoarthritis,    Abdominal (+) + obese,   Peds negative pediatric ROS (+)  Hematology negative hematology ROS (+)   Anesthesia Other Findings   Reproductive/Obstetrics negative OB ROS                             Anesthesia Physical Anesthesia Plan  ASA: II  Anesthesia Plan: General   Post-op Pain Management:    Induction: Intravenous  PONV Risk Score and Plan: 4 or greater and Ondansetron, Dexamethasone, Midazolam and Scopolamine patch - Pre-op  Airway Management Planned: Oral ETT  Additional Equipment:   Intra-op Plan:   Post-operative Plan: Extubation in OR  Informed Consent: I have reviewed the patients History and Physical, chart, labs and discussed the procedure including the risks, benefits and alternatives for the proposed anesthesia with the patient or authorized representative who has indicated his/her understanding and acceptance.   Dental advisory given  Plan Discussed with: CRNA  Anesthesia Plan Comments:         Anesthesia Quick  Evaluation

## 2017-07-01 NOTE — Transfer of Care (Signed)
Immediate Anesthesia Transfer of Care Note  Patient: Anna Gallegos  Procedure(s) Performed: LAPAROSCOPIC ROUX-EN-Y GASTRIC BYPASS WITH HIATAL HERNIA REPAIR AND UPPER ENDOSCOPY (N/A Abdomen)  Patient Location: PACU  Anesthesia Type:General  Level of Consciousness: oriented, drowsy and patient cooperative  Airway & Oxygen Therapy: Patient Spontanous Breathing and face mask  Post-op Assessment: Report given to RN and Post -op Vital signs reviewed and stable  Post vital signs: Reviewed and stable  Last Vitals:  Vitals Value Taken Time  BP 125/76 07/01/2017  3:00 PM  Temp    Pulse 99 07/01/2017  3:01 PM  Resp 17 07/01/2017  3:01 PM  SpO2 98 % 07/01/2017  3:01 PM  Vitals shown include unvalidated device data.  Last Pain:  Vitals:   07/01/17 1042  TempSrc:   PainSc: 2       Patients Stated Pain Goal: 4 (81/19/14 7829)  Complications: No apparent anesthesia complications

## 2017-07-02 ENCOUNTER — Encounter (HOSPITAL_COMMUNITY): Payer: Self-pay | Admitting: Surgery

## 2017-07-02 LAB — CBC WITH DIFFERENTIAL/PLATELET
Basophils Absolute: 0 10*3/uL (ref 0.0–0.1)
Basophils Relative: 0 %
Eosinophils Absolute: 0 10*3/uL (ref 0.0–0.7)
Eosinophils Relative: 0 %
HCT: 39.3 % (ref 36.0–46.0)
Hemoglobin: 12.4 g/dL (ref 12.0–15.0)
LYMPHS ABS: 1.4 10*3/uL (ref 0.7–4.0)
LYMPHS PCT: 15 %
MCH: 28.8 pg (ref 26.0–34.0)
MCHC: 31.6 g/dL (ref 30.0–36.0)
MCV: 91.2 fL (ref 78.0–100.0)
MONO ABS: 0.4 10*3/uL (ref 0.1–1.0)
MONOS PCT: 4 %
Neutro Abs: 7.9 10*3/uL — ABNORMAL HIGH (ref 1.7–7.7)
Neutrophils Relative %: 81 %
Platelets: 311 10*3/uL (ref 150–400)
RBC: 4.31 MIL/uL (ref 3.87–5.11)
RDW: 13.7 % (ref 11.5–15.5)
WBC: 9.8 10*3/uL (ref 4.0–10.5)

## 2017-07-02 LAB — COMPREHENSIVE METABOLIC PANEL
ALT: 98 U/L — ABNORMAL HIGH (ref 14–54)
ANION GAP: 12 (ref 5–15)
AST: 80 U/L — ABNORMAL HIGH (ref 15–41)
Albumin: 3.7 g/dL (ref 3.5–5.0)
Alkaline Phosphatase: 100 U/L (ref 38–126)
BILIRUBIN TOTAL: 0.6 mg/dL (ref 0.3–1.2)
BUN: 10 mg/dL (ref 6–20)
CO2: 25 mmol/L (ref 22–32)
Calcium: 9.1 mg/dL (ref 8.9–10.3)
Chloride: 103 mmol/L (ref 101–111)
Creatinine, Ser: 0.75 mg/dL (ref 0.44–1.00)
GFR calc Af Amer: >60 mL/min (ref >60)
Glucose, Bld: 119 mg/dL — ABNORMAL HIGH (ref 65–99)
POTASSIUM: 4 mmol/L (ref 3.5–5.1)
Sodium: 140 mmol/L (ref 135–145)
TOTAL PROTEIN: 7.5 g/dL (ref 6.5–8.1)

## 2017-07-02 MED ORDER — ACETAMINOPHEN 160 MG/5ML PO SOLN: 650.0000 mg | Freq: Four times a day (QID) | ORAL | 0 refills | Status: AC | PRN

## 2017-07-02 MED ORDER — PANTOPRAZOLE SODIUM 40 MG PO PACK: 40.0000 mg | PACK | Freq: Every day | ORAL | Status: AC

## 2017-07-02 MED ORDER — OXYCODONE HCL 5 MG/5ML PO SOLN: 5.0000 mg | Freq: Four times a day (QID) | ORAL | 0 refills | Status: AC | PRN

## 2017-07-02 MED ORDER — ONDANSETRON 4 MG PO TBDP: 4.0000 mg | ORAL_TABLET | Freq: Three times a day (TID) | ORAL | 0 refills | Status: AC | PRN

## 2017-07-02 NOTE — Progress Notes (Signed)
Pt explained discharge instructions and all questions were answered.

## 2017-07-02 NOTE — Progress Notes (Signed)
Patient alert and oriented, Post op day 1.  Provided support and encouragement.  Encouraged pulmonary toilet, ambulation and small sips of liquids.  Completed 12 ounces of clear fluids started protein shakes.  All questions answered.  Will continue to monitor.

## 2017-07-02 NOTE — Progress Notes (Signed)
Patient alert and oriented, pain is controlled. Patient is tolerating fluids, advanced to protein shake today, patient is tolerating well. Reviewed Gastric Bypass discharge instructions with patient and patient is able to articulate understanding. Provided information on BELT program, Support Group and [facility] outpatient pharmacy. All questions answered, will continue to monitor.   Patient completed 840 fluid ounces in 24 hours Per Dehydration protocol patient call back one week postop

## 2017-07-02 NOTE — Discharge Summary (Signed)
Physician Discharge Summary  Anna Gallegos:[id number] DOB: 01-22-1966 DOA: 07/01/2017  PCP: Guy Begin, FNP  Admit date: 07/01/2017 Discharge date:  07/02/2017   Recommendations for Outpatient Follow-up:    Follow-up Information    [name], MD. Go on 07/17/2017.   Specialty:  General Surgery Why:  at 155 Contact information: [address] [place] [address] [phone]        [name], MD .   Specialty:  General Surgery Contact information: [address] [place] [address] [phone]          Discharge Diagnoses:  Active Problems:   Morbid obesity ([place])   Surgical Procedure: Laparoscopic Roux-en-Y gastric bypass, upper endoscopy  Discharge Condition: Good Disposition: Home  Diet recommendation: Postoperative gastric bypass diet  Filed Weights   07/01/17 1048  Weight: 98.5 kg (217 lb 3.2 oz)     Hospital Course:  The patient was admitted for a planned laparoscopic Roux-en-Y gastric bypass. Please see operative note. Preoperatively the patient was given lovenox for DVT prophylaxis. ERAS protocol was used. Postoperative prophylactic Lovenox dosing was started on the evening of postoperative day 0.  The patient was started on ice chips and water on the evening of POD 0 which they tolerated. On postoperative day 1 The patient's diet was advanced to protein shakes which they also tolerated. The patient was ambulating without difficulty. Their vital signs are stable without fever or tachycardia. Their hemoglobin had remained stable. The patient was maintained on their home settings for CPAP therapy. The patient had received discharge instructions and counseling. They were deemed stable for discharge.  BP 140/78 (BP Location: Left Arm)   Pulse 65   Temp 97.8 F (36.6 C) (Oral)   Resp 18   Ht 5' 2.25" (1.581 m)   Wt 98.5 kg (217 lb 3.2 oz)   SpO2 98%   BMI 39.41 kg/m   Gen: alert, NAD,  non-toxic appearing Pupils: equal, no scleral icterus Pulm: Lungs clear to auscultation, symmetric chest rise CV: regular rate and rhythm Abd: soft, min tender, nondistended. No cellulitis. No incisional hernia Ext: no edema, no calf tenderness Skin: no rash, no jaundice  Discharge Instructions  Discharge Instructions    Ambulate hourly while awake   Complete by:  As directed    Call MD for:  difficulty breathing, headache or visual disturbances   Complete by:  As directed    Call MD for:  persistant dizziness or light-headedness   Complete by:  As directed    Call MD for:  persistant nausea and vomiting   Complete by:  As directed    Call MD for:  redness, tenderness, or signs of infection (pain, swelling, redness, odor or green/yellow discharge around incision site)   Complete by:  As directed    Call MD for:  severe uncontrolled pain   Complete by:  As directed    Call MD for:  temperature >101 F   Complete by:  As directed    Incentive spirometry   Complete by:  As directed    Perform hourly while awake     Allergies as of 07/02/2017      Reactions   Bactrim [sulfamethoxazole-trimethoprim] Hives   Latex Hives      Medication List    STOP taking these medications   acetaminophen 325 MG tablet Commonly known as:  TYLENOL Replaced by:  acetaminophen 160 MG/5ML solution   omeprazole 20 MG capsule Commonly known  as:  PRILOSEC   traMADol-acetaminophen 37.5-325 MG tablet Commonly known as:  ULTRACET     TAKE these medications   acetaminophen 160 MG/5ML solution Commonly known as:  TYLENOL Take 20.3 mLs (650 mg total) by mouth every 6 (six) hours as needed. Replaces:  acetaminophen 325 MG tablet   albuterol 108 (90 Base) MCG/ACT inhaler Commonly known as:  PROVENTIL HFA;VENTOLIN HFA Inhale 2 puffs into the lungs every 4 (four) hours as needed for wheezing or shortness of breath.   conjugated estrogens vaginal cream Commonly known as:  PREMARIN Place 1  Applicatorful vaginally daily.   multivitamin with minerals tablet Take 1 tablet by mouth daily.   ondansetron 4 MG disintegrating tablet Commonly known as:  ZOFRAN ODT Take 1 tablet (4 mg total) by mouth every 8 (eight) hours as needed for nausea or vomiting.   oxyCODONE 5 MG/5ML solution Commonly known as:  ROXICODONE Take 5 mLs (5 mg total) by mouth every 6 (six) hours as needed for moderate pain or severe pain (Please give non-narcotics first).   pantoprazole sodium 40 mg/20 mL Pack Commonly known as:  PROTONIX Take 20 mLs (40 mg total) by mouth daily.   REFRESH PLUS 0.5 % Soln Generic drug:  Carboxymethylcellulose Sod PF Place 1 drop into both eyes 2 (two) times daily as needed.   Vitamin D (Ergocalciferol) 50000 units Caps capsule Commonly known as:  DRISDOL Take 50,000 Units by mouth every 7 (seven) days. Friday      Follow-up Information    [name], MD. Go on 07/17/2017.   Specialty:  General Surgery Why:  at 155 Contact information: [address] [place] [address] [phone]        [name], MD .   Specialty:  General Surgery Contact information: [address] [place] [address] [phone]            The results of significant diagnostics from this hospitalization (including imaging, microbiology, ancillary and laboratory) are listed below for reference.    Significant Diagnostic Studies: No results found.  Labs: Basic Metabolic Panel: Recent Labs  Lab 07/01/17 1045 07/02/17 0532  NA 140 140  K 3.9 4.0  CL 104 103  CO2 26 25  GLUCOSE 104* 119*  BUN 14 10  CREATININE 0.78 0.75  CALCIUM 9.2 9.1   Liver Function Tests: Recent Labs  Lab 07/01/17 1045 07/02/17 0532  AST 24 80*  ALT 30 98*  ALKPHOS 105 100  BILITOT 0.4 0.6  PROT 7.3 7.5  ALBUMIN 3.6 3.7    CBC: Recent Labs  Lab 07/01/17 1045 07/02/17 0532  WBC 5.4 9.8  NEUTROABS  --  7.9*  HGB 12.4 12.4   HCT 38.7 39.3  MCV 89.6 91.2  PLT 261 311    CBG: No results for input(s): GLUCAP in the last 168 hours.  Active Problems:   Morbid obesity [facility])  Signed:  [facility] 07/02/2017, 2:11 PM

## 2017-07-02 NOTE — Progress Notes (Signed)
S: Slept well. Pain controlled. No nausea or emesis. No dysphagia. Walking. Tolerating liquids.   Vitals, labs, intake/output, and orders reviewed at this time.  Gen: A&Ox3, no distress  H&N: EOMI, atraumatic, neck supple Chest: unlabored respirations, RRR Abd: soft, appropriately mildly tender, nondistended, incision c/d/i  Ext: warm, no edema Neuro: grossly normal  Lines/tubes/drains: PIV  A/P:  POD 1 lap gastric bypass, doing well Ambulate Continue clears, protein shakes Pulmonary toilet   Romana Juniper, MD [facility], [place] Pager [phone]

## 2017-07-02 NOTE — Anesthesia Postprocedure Evaluation (Signed)
Anesthesia Post Note  Patient: ELLAMARIE NAEVE  Procedure(s) Performed: LAPAROSCOPIC ROUX-EN-Y GASTRIC BYPASS WITH HIATAL HERNIA REPAIR AND UPPER ENDOSCOPY (N/A Abdomen)     Patient location during evaluation: PACU Anesthesia Type: General Level of consciousness: awake and alert Pain management: pain level controlled Vital Signs Assessment: post-procedure vital signs reviewed and stable Respiratory status: spontaneous breathing, nonlabored ventilation and respiratory function stable Cardiovascular status: blood pressure returned to baseline and stable Postop Assessment: no apparent nausea or vomiting Anesthetic complications: no    Last Vitals:  Vitals:   07/02/17 0158 07/02/17 0602  BP: 113/88 110/72  Pulse: 71 69  Resp: 16 16  Temp: 36.9 C 36.6 C  SpO2: 97% 99%    Last Pain:  Vitals:   07/02/17 0634  TempSrc:   PainSc: Asleep                 Lynda Rainwater

## 2017-07-02 NOTE — Plan of Care (Signed)

## 2017-07-02 NOTE — Progress Notes (Signed)
Pt started drinking first 2oz of protein at 0743.

## 2017-07-08 ENCOUNTER — Telehealth (HOSPITAL_COMMUNITY): Payer: Self-pay

## 2017-07-08 NOTE — Telephone Encounter (Signed)
Patient called to discuss post bariatric surgery follow up questions.  See below:   1.  Tell me about your pain and pain management?taking tylenol intermittently pain controlled with this  2.  Let's talk about fluid intake.  How much total fluid are you taking in?60-64 ounces of fluid  3.  How much protein have you taken in the last 2 days?60 grams per day  4.  Have you had nausea?  Tell me about when have experienced nausea and what you did to help?has not had nausea  5.  Has the frequency or color changed with your urine?urinating frequently light in color  6.  Tell me what your incisions look like?steri strips starting to come off no problems just itchy  7.  Have you been passing gas? BM?passing gas having regular bm's  8.  If a problem or question were to arise who would you call?  Do you know contact numbers for [place], [facility], and [facility]?aware of all contact information  9.  How has the walking going?walking every hour went to  [employer] today to walk with spouse, not driving yet  10.  How are your vitamins and calcium going?  How are you taking them?appropriately explained how to take vitamins and calcium to [facility]  Verified information for post op class to advance diet.  No further questions

## 2017-07-16 ENCOUNTER — Encounter: Payer: BLUE CROSS/BLUE SHIELD | Attending: Surgery | Admitting: Registered"

## 2017-07-16 DIAGNOSIS — E669 Obesity, unspecified: Secondary | ICD-10-CM

## 2017-07-16 DIAGNOSIS — Z6837 Body mass index (BMI) 37.0-37.9, adult: Secondary | ICD-10-CM | POA: Diagnosis not present

## 2017-07-16 DIAGNOSIS — Z713 Dietary counseling and surveillance: Secondary | ICD-10-CM | POA: Diagnosis not present

## 2017-07-17 NOTE — Progress Notes (Signed)
Bariatric Class:  Appt start time: 1530 end time:  1630.  2 Week Post-Operative Nutrition Class  Patient was seen on 07/16/2017 for Post-Operative Nutrition [facility].   Surgery date: 07/01/2017 Surgery type: RYGB Start weight at [facility]: 209.7 Weight today: 204.0 Weight change: 5.7 lbs loss  Pt states she is drinking 96 ounces of fluid and 60 grams of protein. Pt states she has some gas and abdominal pain.   TANITA  BODY COMP RESULTS  07/16/2017   BMI (kg/m^2) 37.3   Fat Mass (lbs) 99.0   Fat Free Mass (lbs) 105.0   Total Body Water (lbs) 75.6   The following the learning objectives were met by the patient during this course:  Identifies Phase 3A (Soft, High Proteins) Dietary Goals and will begin from 2 weeks post-operatively to 2 months post-operatively  Identifies appropriate sources of fluids and proteins   States protein recommendations and appropriate sources post-operatively  Identifies the need for appropriate texture modifications, mastication, and bite sizes when consuming solids  Identifies appropriate multivitamin and calcium sources post-operatively  Describes the need for physical activity post-operatively and will follow MD recommendations  States when to call healthcare provider regarding medication questions or post-operative complications  Handouts given during class include:  Phase 3A: Soft, High Protein Diet Handout  Follow-Up Plan: Patient will follow-up at [facility] in 6 weeks for 2 month post-op nutrition visit for diet advancement per MD.

## 2017-07-23 ENCOUNTER — Telehealth: Payer: Self-pay | Admitting: Registered"

## 2017-07-23 NOTE — Telephone Encounter (Signed)
Pt returned phone call. RD had previously called pt to verify fluid intake once starting soft, solid proteins 2 week post-bariatric surgery.   Daily Fluid intake: 96 ounces Daily Protein intake: 60+ grams  Concerns/issues: none stated

## 2017-07-23 NOTE — Telephone Encounter (Signed)
RD called pt to verify fluid intake once starting soft, solid proteins 2 week post-bariatric surgery. Pt was unavailable; left voicemail.

## 2017-08-26 ENCOUNTER — Ambulatory Visit: Payer: BLUE CROSS/BLUE SHIELD | Admitting: Registered"

## 2017-09-12 ENCOUNTER — Encounter: Payer: Self-pay | Admitting: Registered"

## 2017-09-12 ENCOUNTER — Encounter: Payer: BLUE CROSS/BLUE SHIELD | Attending: Surgery | Admitting: Registered"

## 2017-09-12 DIAGNOSIS — Z6837 Body mass index (BMI) 37.0-37.9, adult: Secondary | ICD-10-CM | POA: Insufficient documentation

## 2017-09-12 DIAGNOSIS — Z713 Dietary counseling and surveillance: Secondary | ICD-10-CM | POA: Insufficient documentation

## 2017-09-12 DIAGNOSIS — E669 Obesity, unspecified: Secondary | ICD-10-CM

## 2017-09-12 NOTE — Patient Instructions (Signed)
Goals:  Follow Phase 3B: High Protein + Non-Starchy Vegetables  Eat 3-6 small meals/snacks, every 3-5 hrs  Increase lean protein foods to meet 60g goal  Increase fluid intake to 64oz +  Avoid drinking 15 minutes before, during and 30 minutes after eating  Aim for >30 min of physical activity daily  

## 2017-09-12 NOTE — Progress Notes (Signed)
Follow-up visit:  8 Weeks Post-Operative RYGB Surgery  Medical Nutrition Therapy:  Appt start time: 8:30 end time:  9:09.  Primary concerns today: Post-operative Bariatric Surgery Nutrition Management.  Non scale victories: less heartburn, not sweating as much  Surgery date: 07/01/2017 Surgery type: RYGB Start weight at [facility]: 209.7 Weight today: 181.0 Weight change: 23 lbs loss from 204.0 (07/16/2017) Total weight lost: 28.7 lbs Weight loss goal: walk up stairs without having short of breath, able to run again, relieve back pain   TANITA  BODY COMP RESULTS  07/16/2017 09/12/2017   BMI (kg/m^2) 37.3 32.6   Fat Mass (lbs) 99.0 76.8   Fat Free Mass (lbs) 105.0 104.2   Total Body Water (lbs) 75.6 74.0   Pt states she gets cold all the time now. Pt states sometimes grilled chicken will go down and sometimes it doesn't. Pt states she will eat peanut butter at night when she skips a meal during the day or has not completed an meal earlier. Pt states she needs protein shakes on work days to meet protein goals. Pt states she does not tolerate boiled eggs well.   Preferred Learning Style:   No preference indicated   Learning Readiness:   Ready  Change in progress  24-hr recall: B (AM): egg (6g), [place] sausage (3g) Snk (AM): protein shake (30g) or greek yogurt (12-15g) L (PM): 1-1.5 oz grilled chicken (7-10.5g) Snk (PM): none  D (PM): morning star veggie dog (8g) with Hormel can chili Snk (PM): peanut butter (7g)  Fluid intake: water with flavor; 96 oz Estimated total protein intake: 55+ grams  Medications: See list Supplementation: Celebrate + 3 calcium   Using straws: at work Drinking while eating: no Having you been chewing well: yes Chewing/swallowing difficulties: no Changes in vision: no Changes to mood/headaches: no Hair loss/Changes to skin/Changes to nails: hair thinning, no, no Any difficulty focusing or concentrating: no Sweating: less  sweating Dizziness/Lightheaded: no Palpitations: no  Carbonated beverages: no N/V/D/C/GAS: no, no, no, no-taking Miralax or Milk of Magnesia, no Abdominal Pain: no Dumping syndrome: no Last Lap-Band fill: N/A  Recent physical activity: Cardio 30-40 min, 5 days/week  Progress Towards Goal(s):  In progress.  Handouts given during visit include:  Phase IV: High protein + NS vegetables   Nutritional Diagnosis:  Anna Gallegos Overweight/obesity related to past poor dietary habits and physical inactivity as evidenced by patient w/ recent RYGB surgery following dietary guidelines for continued weight loss.     Intervention:  Nutrition education and counseling. Pt was educated and counseled on the next phase of the diet. Pt was in agreement with goals listed.  Goals:  Follow Phase 3B: High Protein + Non-Starchy Vegetables  Eat 3-6 small meals/snacks, every 3-5 hrs  Increase lean protein foods to meet 60g goal  Increase fluid intake to 64oz +  Avoid drinking 15 minutes before, during and 30 minutes after eating  Aim for >30 min of physical activity   Teaching Method Utilized:  Visual Auditory Hands on  Barriers to learning/adherence to lifestyle change: work-life balance  Demonstrated degree of understanding via:  Teach Back   Monitoring/Evaluation:  Dietary intake, exercise, lap band fills, and body weight. Follow up in 3.5 months for 6 month post-op visit.

## 2017-12-10 ENCOUNTER — Encounter: Payer: BLUE CROSS/BLUE SHIELD | Attending: Surgery | Admitting: Skilled Nursing Facility1

## 2017-12-10 DIAGNOSIS — Z6837 Body mass index (BMI) 37.0-37.9, adult: Secondary | ICD-10-CM | POA: Insufficient documentation

## 2017-12-10 DIAGNOSIS — Z713 Dietary counseling and surveillance: Secondary | ICD-10-CM | POA: Insufficient documentation

## 2017-12-12 NOTE — Progress Notes (Signed)
Follow-up visit:  Post-Operative RYGB Surgery  Medical Nutrition Therapy:  Appt start time: 6:00pm end time:  7:00pm  Primary concerns today: Post-operative Bariatric Surgery Nutrition Management 6 Month Post-Op Class  Surgery date: 07/01/2017 Surgery type: RYGB Start weight at [facility]: 209.7 Weight today: 153.8  TANITA  BODY COMP RESULTS  07/16/2017 09/12/2017 12/12/2017   BMI (kg/m^2) 37.3 32.6 28.1   Fat Mass (lbs) 99.0 76.8 58   Fat Free Mass (lbs) 105.0 104.2 95.8   Total Body Water (lbs) 75.6 74.0 67     Information Reviewed/ Discussed During Appointment: -Review of composition scale numbers -Fluid requirements (64-100 ounces) -Protein requirements (60-80g) -Strategies for tolerating diet -Advancement of diet to include Starchy vegetables -Barriers to inclusion of new foods -Inclusion of appropriate multivitamin and calcium supplements  -Exercise recommendations   Fluid intake: goal Medications: see list Supplementation: taking   Using straws: no Drinking while eating: no Having you been chewing well: yes Chewing/swallowing difficulties: no Changes in vision: no Changes to mood/headaches: no Hair loss/Cahnges to skin/Changes to nails: no Any difficulty focusing or concentrating: no Sweating: no Dizziness/Lightheaded: no Palpitations: no  Carbonated beverages: no N/V/D/C/GAS: no Abdominal Pain: no Dumping syndrome: no  Recent physical activity:  ADL's  Progress Towards Goal(s):  In progress.  Handouts given during visit include:  Phase V diet Progression   Goals Sheet  The Benefits of Exercise are endless.....  Support Group Topics  Pt Chosen Goals:  I will chew my food well I will work out more  Teaching Method Utilized:  Ship broker Hands on   Demonstrated degree of understanding via:  Teach Back   Monitoring/Evaluation:  Dietary intake, exercise, and body weight. Follow up in 3 months for 9 month post-op visit.

## 2018-04-10 ENCOUNTER — Encounter: Payer: BLUE CROSS/BLUE SHIELD | Attending: Surgery | Admitting: Skilled Nursing Facility1

## 2018-04-10 ENCOUNTER — Encounter: Payer: Self-pay | Admitting: Skilled Nursing Facility1

## 2018-04-10 NOTE — Patient Instructions (Addendum)
Ensure your yogurt has at least 15 grams of carbohydrate   Eat 3 carb choices 3 times a time  Try peanut butter on your waffle  Limit yourself to one sugar free popcicle or sugar free jello   In your water: herbs or lemon or frozen fruit (starwberries) aim for at least 32 ounces of this

## 2018-04-10 NOTE — Progress Notes (Signed)
Follow-up visit:  Post-Operative RYGB Surgery  Medical Nutrition Therapy:  Appt start time: 6:00pm end time:  7:00pm  Primary concerns today: Post-operative Bariatric Surgery Nutrition Management 6 Month Post-Op Class  Pt state she lifts 80 pounds every 5-7 minutes at work. Pt states she does not like plain water and likes flavors in her water. Pt states she stays tired all the time working 12 hour night shifts. Pt states she hates chicken and fish. Pt states she is worried about her hair loss.   Surgery date: 07/01/2017 Surgery type: RYGB Start weight at [facility]: 209.7 Weight today: pt declined her weight but dietitian insisted she use the comp scale to which the pt agreed    137.4  TANITA  BODY COMP RESULTS  07/16/2017 09/12/2017 12/12/2017 04/10/2018   BMI (kg/m^2) 37.3 32.6 28.1 25.1   Fat Mass (lbs) 99.0 76.8 58 42.6   Fat Free Mass (lbs) 105.0 104.2 95.8 94.8   Total Body Water (lbs) 75.6 74.0 67 65.6   24 hour recall: avoiding bread, pasta, rice At work due to have short breaks and afraid to throw up Jethro Bolus without bun and salad or eggs (not sitting well) or 1 protein waffle and [place] sausage  Meat and cheese roll up with cucumber and celery and carrots yogurt  Bag of pork rinds  shakes Cheese Carb smart fudge bar Nuts At home: Omelet or protein waffle Yogurt   Fluid intake: 60-100 oz Medications: see list Supplementation: multi and calcium and vitamin D and biotin   Using straws: no Drinking while eating: no Having you been chewing well: yes Chewing/swallowing difficulties: no Changes in vision: no Changes to mood/headaches: no Hair loss/Changes to skin/Changes to nails: yes Any difficulty focusing or concentrating: no Sweating: no Dizziness/Lightheaded: no Palpitations: no  Carbonated beverages: no N/V/D/C/GAS: no Abdominal Pain: no Dumping syndrome: no  Recent physical activity:  ADL's  Progress Towards Goal(s):  In progress.  Pt Goals:  Ensure your  yogurt has at least 15 grams of carbohydrate  Eat 3 carb choices 7 days a week Try peanut butter on your waffle Limit yourself to one sugar free popcicle or sugar free jello In your water: herbs or lemon or frozen fruit (starwberries) aim for at least 32 ounces of this   Teaching Method Utilized:  Visual Auditory Hands on  Demonstrated degree of understanding via:  Teach Back   Monitoring/Evaluation:  Dietary intake, exercise, and body weight. Follow up in 1 month

## 2018-06-19 ENCOUNTER — Ambulatory Visit: Payer: BLUE CROSS/BLUE SHIELD | Admitting: Skilled Nursing Facility1

## 2018-12-14 IMAGING — DX DG CHEST 2V
2 series · 2 of 2 positions shown · non-contrast
Comparison: None.

CLINICAL DATA: Pre-bariatric work up, no chest pain or sob, some
cough and congestion, patient thinks she has allergies, no HTN, non
smoker

EXAM:
CHEST  2 VIEW

[chest pa]
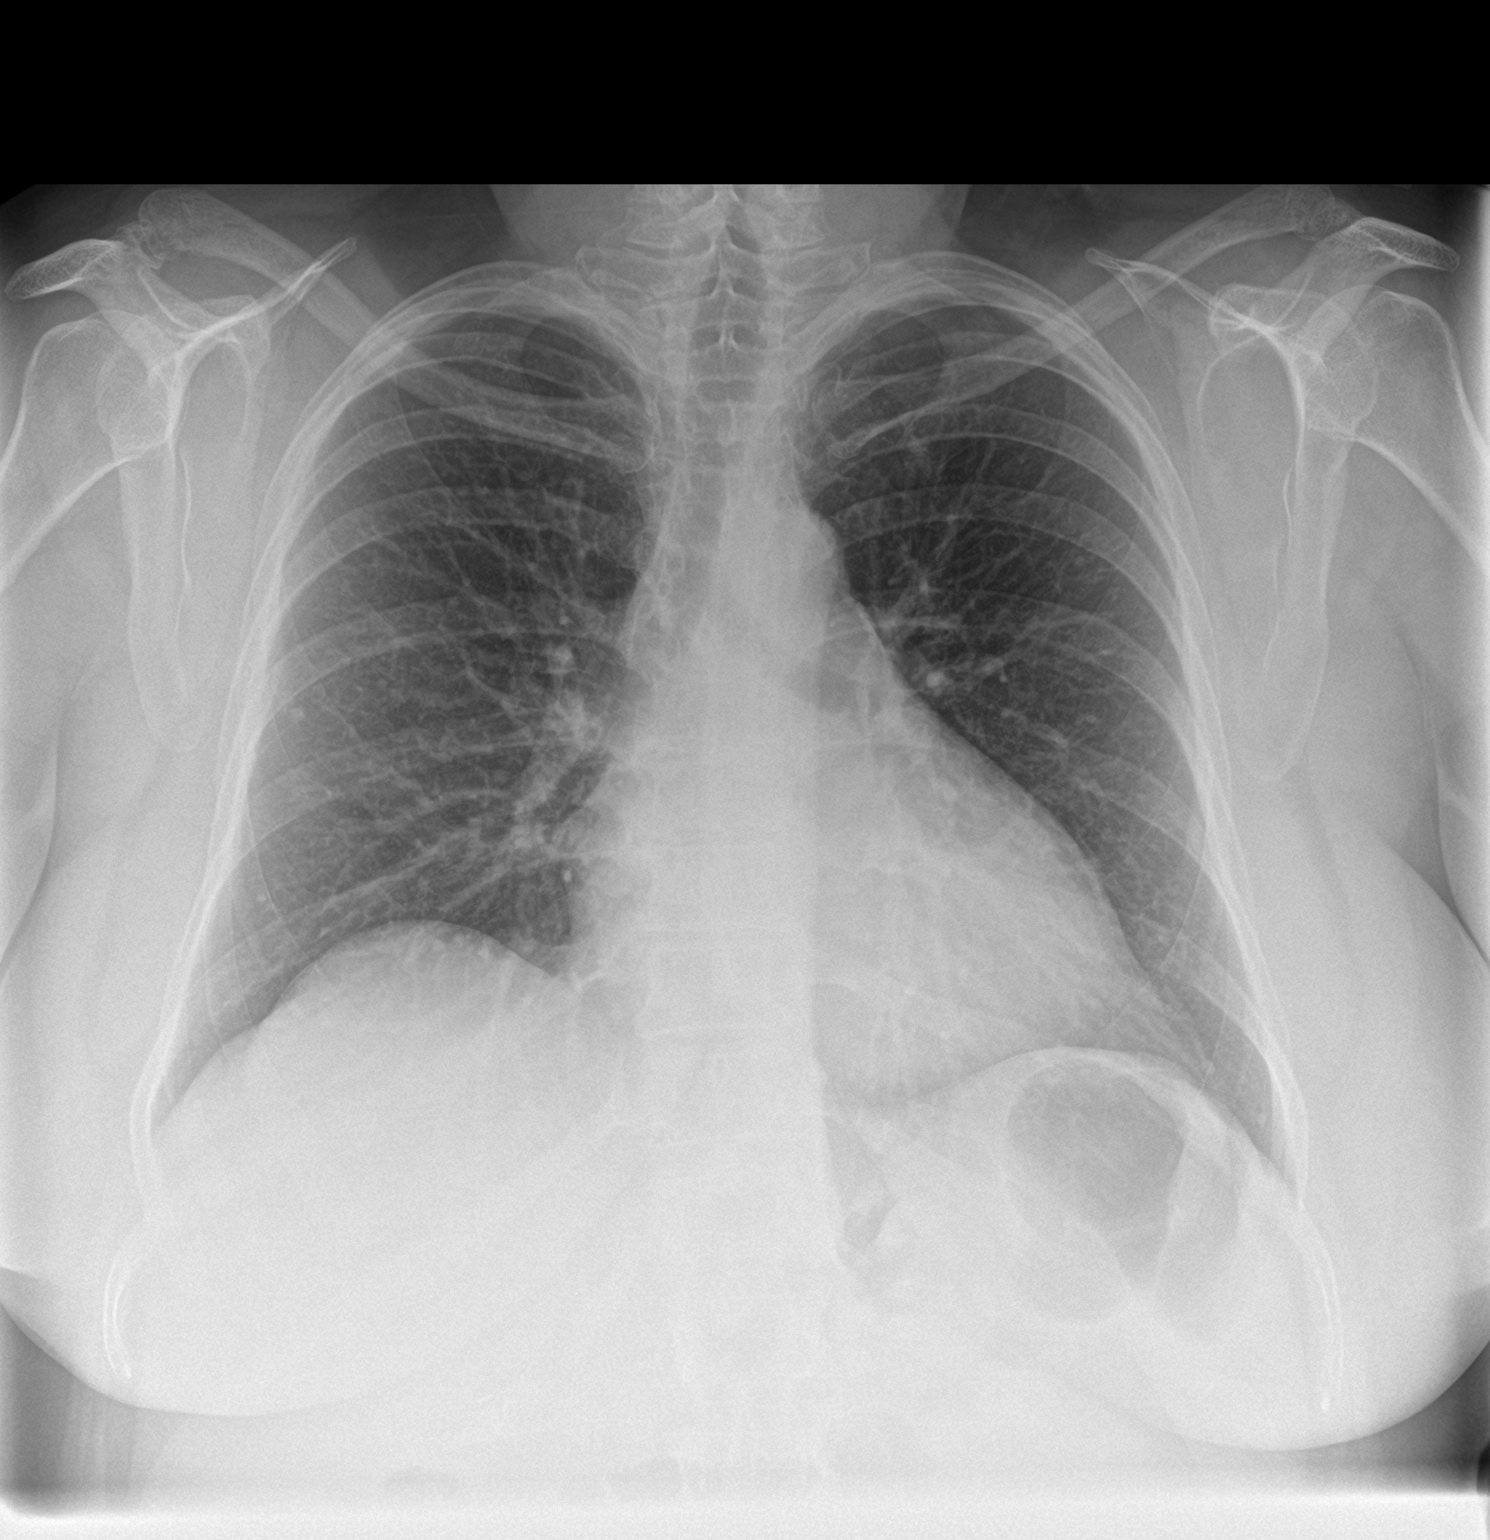

[chest lat]
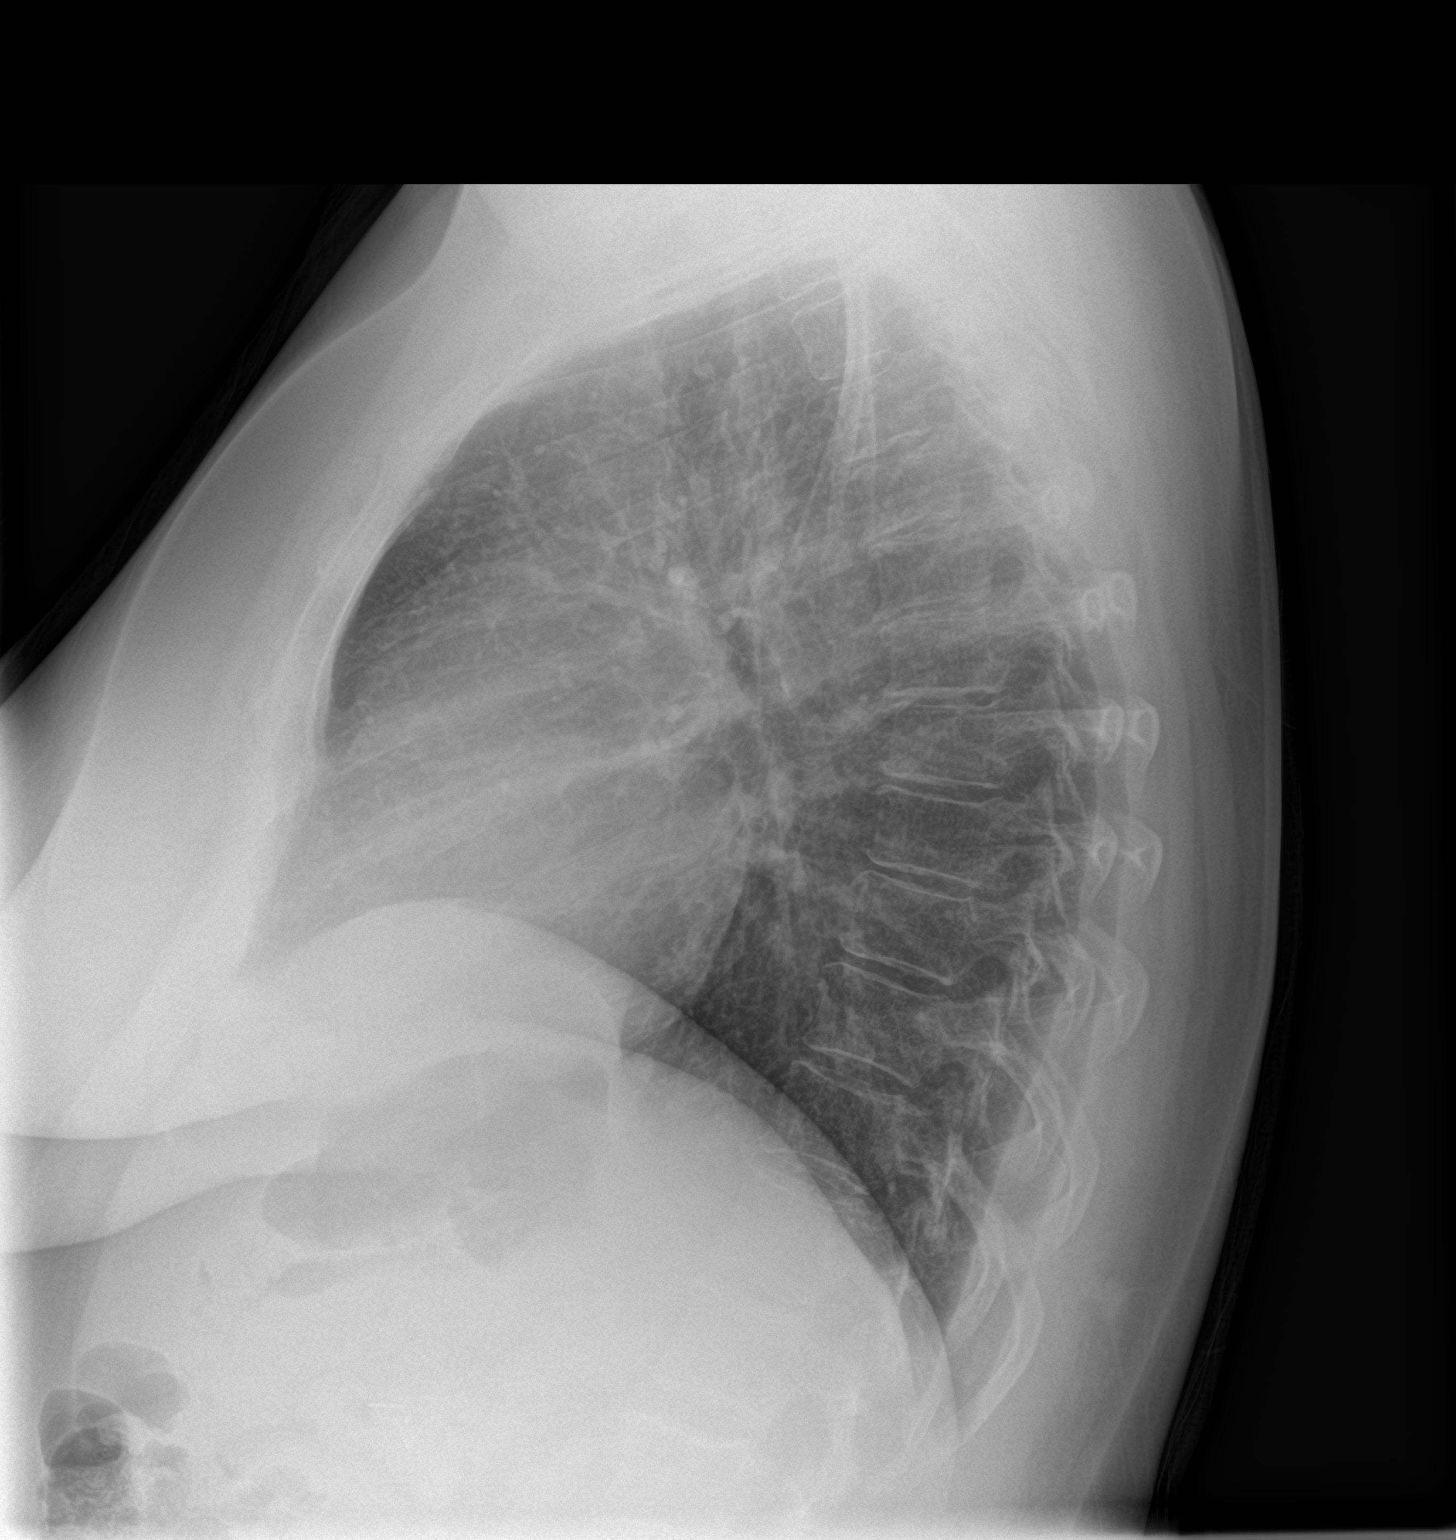

[2 of 2 positions shown; findings below may reference images not displayed]

FINDINGS: Minimal pectus excavatum deformity. Mild thoracic spondylosis. Right
hemidiaphragm elevation. Midline trachea. Mild cardiomegaly. No
pleural effusion or pneumothorax. Lateral right upper lobe calcified
granuloma of 4 mm. No lobar consolidation. No congestive failure.
IMPRESSION: Cardiomegaly without congestive failure.

## 2021-01-20 ENCOUNTER — Encounter (HOSPITAL_COMMUNITY): Payer: Self-pay | Admitting: *Deleted

## 2022-02-02 ENCOUNTER — Encounter (HOSPITAL_COMMUNITY): Payer: Self-pay | Admitting: *Deleted

## 2023-01-25 ENCOUNTER — Encounter (HOSPITAL_COMMUNITY): Payer: Self-pay | Admitting: *Deleted
